# Patient Record
Sex: Female | Born: 1985 | Race: White | Hispanic: No | Marital: Married | Smoking: Never smoker
Health system: Southern US, Community
[De-identification: ages and names within clinical notes are randomized; demographics above are authoritative.]

## PROBLEM LIST (undated history)

## (undated) ENCOUNTER — Inpatient Hospital Stay (HOSPITAL_COMMUNITY): Payer: Self-pay

## (undated) DIAGNOSIS — T8859XA Other complications of anesthesia, initial encounter: Secondary | ICD-10-CM

## (undated) DIAGNOSIS — O24419 Gestational diabetes mellitus in pregnancy, unspecified control: Secondary | ICD-10-CM

## (undated) DIAGNOSIS — R519 Headache, unspecified: Secondary | ICD-10-CM

## (undated) DIAGNOSIS — E282 Polycystic ovarian syndrome: Secondary | ICD-10-CM

## (undated) DIAGNOSIS — T4145XA Adverse effect of unspecified anesthetic, initial encounter: Secondary | ICD-10-CM

## (undated) DIAGNOSIS — B977 Papillomavirus as the cause of diseases classified elsewhere: Secondary | ICD-10-CM

## (undated) DIAGNOSIS — M5020 Other cervical disc displacement, unspecified cervical region: Secondary | ICD-10-CM

## (undated) DIAGNOSIS — O009 Unspecified ectopic pregnancy without intrauterine pregnancy: Secondary | ICD-10-CM

## (undated) DIAGNOSIS — E119 Type 2 diabetes mellitus without complications: Secondary | ICD-10-CM

## (undated) DIAGNOSIS — E079 Disorder of thyroid, unspecified: Secondary | ICD-10-CM

## (undated) DIAGNOSIS — Z8619 Personal history of other infectious and parasitic diseases: Secondary | ICD-10-CM

## (undated) DIAGNOSIS — Z8639 Personal history of other endocrine, nutritional and metabolic disease: Secondary | ICD-10-CM

## (undated) DIAGNOSIS — N946 Dysmenorrhea, unspecified: Secondary | ICD-10-CM

## (undated) DIAGNOSIS — K219 Gastro-esophageal reflux disease without esophagitis: Secondary | ICD-10-CM

## (undated) HISTORY — DX: Disorder of thyroid, unspecified: E07.9

## (undated) HISTORY — DX: Papillomavirus as the cause of diseases classified elsewhere: B97.7

## (undated) HISTORY — DX: Dysmenorrhea, unspecified: N94.6

## (undated) HISTORY — PX: TONSILLECTOMY: SUR1361

## (undated) HISTORY — DX: Other cervical disc displacement, unspecified cervical region: M50.20

## (undated) HISTORY — PX: CRANIOTOMY: SHX93

## (undated) HISTORY — DX: Personal history of other endocrine, nutritional and metabolic disease: Z86.39

## (undated) HISTORY — DX: Polycystic ovarian syndrome: E28.2

## (undated) HISTORY — DX: Personal history of other infectious and parasitic diseases: Z86.19

## (undated) HISTORY — PX: SHOULDER SURGERY: SHX246

## (undated) HISTORY — DX: Unspecified ectopic pregnancy without intrauterine pregnancy: O00.90

## (undated) HISTORY — PX: OTHER SURGICAL HISTORY: SHX169

---
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
## 2006-10-06 ENCOUNTER — Ambulatory Visit: Payer: Self-pay | Admitting: Family Medicine

## 2006-10-06 LAB — CONVERTED CEMR LAB: Hgb A1c MFr Bld: 8 %

## 2006-10-07 ENCOUNTER — Encounter (INDEPENDENT_AMBULATORY_CARE_PROVIDER_SITE_OTHER): Payer: Self-pay | Admitting: Family Medicine

## 2006-10-07 LAB — CONVERTED CEMR LAB
ALT: 56 units/L — ABNORMAL HIGH (ref 0–35)
AST: 42 units/L — ABNORMAL HIGH (ref 0–37)
Albumin: 4.6 g/dL (ref 3.5–5.2)
Alkaline Phosphatase: 51 units/L (ref 39–117)
BUN: 15 mg/dL (ref 6–23)
Basophils Absolute: 0.1 10*3/uL (ref 0.0–0.1)
Basophils Relative: 1 % (ref 0–1)
C-Peptide: 1.83 ng/mL (ref 0.80–3.90)
CO2: 17 meq/L — ABNORMAL LOW (ref 19–32)
Calcium: 9.3 mg/dL (ref 8.4–10.5)
Chloride: 102 meq/L (ref 96–112)
Cholesterol: 212 mg/dL — ABNORMAL HIGH (ref 0–200)
Creatinine, Ser: 0.62 mg/dL (ref 0.40–1.20)
Eosinophils Relative: 2 % (ref 0–5)
Glucose, Bld: 163 mg/dL — ABNORMAL HIGH (ref 70–99)
HCT: 45.4 % (ref 36.0–46.0)
HDL: 57 mg/dL (ref >39)
Hemoglobin: 15.1 g/dL — ABNORMAL HIGH (ref 12.0–15.0)
LDL Cholesterol: 94 mg/dL (ref 0–99)
Lymphocytes Relative: 37 % (ref 12–46)
Lymphs Abs: 2.5 10*3/uL (ref 0.7–3.3)
MCHC: 33.3 g/dL (ref 30.0–36.0)
MCV: 85.8 fL (ref 78.0–100.0)
Monocytes Absolute: 0.4 10*3/uL (ref 0.2–0.7)
Monocytes Relative: 6 % (ref 3–11)
Neutro Abs: 3.6 10*3/uL (ref 1.7–7.7)
Neutrophils Relative %: 54 % (ref 43–77)
Platelets: 275 10*3/uL (ref 150–400)
Potassium: 4.2 meq/L (ref 3.5–5.3)
RBC count: 5.29 10*6/uL
RBC: 5.29 M/uL — ABNORMAL HIGH (ref 3.87–5.11)
RDW: 12.4 % (ref 11.5–14.0)
Sodium: 138 meq/L (ref 135–145)
TSH: 1.977 microintl units/mL (ref 0.350–5.50)
Total Bilirubin: 0.7 mg/dL (ref 0.3–1.2)
Total CHOL/HDL Ratio: 3.7
Total Protein: 7.9 g/dL (ref 6.0–8.3)
Triglycerides: 306 mg/dL — ABNORMAL HIGH (ref <150)
VLDL: 61 mg/dL — ABNORMAL HIGH (ref 0–40)
WBC, blood: 6.7 10*3/uL
WBC: 6.7 10*3/uL (ref 4.0–10.5)

## 2006-10-18 ENCOUNTER — Encounter: Payer: Self-pay | Admitting: Family Medicine

## 2006-10-18 DIAGNOSIS — G43909 Migraine, unspecified, not intractable, without status migrainosus: Secondary | ICD-10-CM | POA: Insufficient documentation

## 2006-10-18 DIAGNOSIS — K589 Irritable bowel syndrome without diarrhea: Secondary | ICD-10-CM | POA: Insufficient documentation

## 2006-10-18 DIAGNOSIS — F411 Generalized anxiety disorder: Secondary | ICD-10-CM | POA: Insufficient documentation

## 2006-10-18 DIAGNOSIS — E119 Type 2 diabetes mellitus without complications: Secondary | ICD-10-CM | POA: Insufficient documentation

## 2006-10-18 DIAGNOSIS — K219 Gastro-esophageal reflux disease without esophagitis: Secondary | ICD-10-CM | POA: Insufficient documentation

## 2006-10-18 DIAGNOSIS — E039 Hypothyroidism, unspecified: Secondary | ICD-10-CM | POA: Insufficient documentation

## 2006-10-24 ENCOUNTER — Ambulatory Visit: Payer: Self-pay | Admitting: Family Medicine

## 2006-10-25 ENCOUNTER — Encounter (INDEPENDENT_AMBULATORY_CARE_PROVIDER_SITE_OTHER): Payer: Self-pay | Admitting: Family Medicine

## 2006-10-25 LAB — CONVERTED CEMR LAB
HCV Ab: NEGATIVE
Hepatitis B Surface Ag: NEGATIVE
Hepatitis B-Post: 27 milliintl units/mL

## 2006-12-08 ENCOUNTER — Telehealth (INDEPENDENT_AMBULATORY_CARE_PROVIDER_SITE_OTHER): Payer: Self-pay | Admitting: Family Medicine

## 2006-12-09 ENCOUNTER — Ambulatory Visit: Payer: Self-pay | Admitting: Family Medicine

## 2006-12-09 DIAGNOSIS — E1165 Type 2 diabetes mellitus with hyperglycemia: Secondary | ICD-10-CM

## 2006-12-09 DIAGNOSIS — IMO0001 Reserved for inherently not codable concepts without codable children: Secondary | ICD-10-CM | POA: Insufficient documentation

## 2006-12-09 DIAGNOSIS — B9789 Other viral agents as the cause of diseases classified elsewhere: Secondary | ICD-10-CM | POA: Insufficient documentation

## 2006-12-09 LAB — CONVERTED CEMR LAB
Glucose, Bld: 173 mg/dL
Inflenza A Ag: NEGATIVE
Rapid Strep: NEGATIVE

## 2006-12-19 ENCOUNTER — Telehealth (INDEPENDENT_AMBULATORY_CARE_PROVIDER_SITE_OTHER): Payer: Self-pay | Admitting: Family Medicine

## 2007-04-05 ENCOUNTER — Telehealth (INDEPENDENT_AMBULATORY_CARE_PROVIDER_SITE_OTHER): Payer: Self-pay | Admitting: Family Medicine

## 2007-04-11 ENCOUNTER — Encounter (INDEPENDENT_AMBULATORY_CARE_PROVIDER_SITE_OTHER): Payer: Self-pay | Admitting: Family Medicine

## 2008-02-24 ENCOUNTER — Encounter: Payer: Self-pay | Admitting: Orthopedic Surgery

## 2008-02-27 ENCOUNTER — Encounter: Payer: Self-pay | Admitting: Orthopedic Surgery

## 2008-03-13 ENCOUNTER — Encounter: Payer: Self-pay | Admitting: Orthopedic Surgery

## 2008-03-18 ENCOUNTER — Ambulatory Visit: Payer: Self-pay | Admitting: Orthopedic Surgery

## 2008-03-18 DIAGNOSIS — M25519 Pain in unspecified shoulder: Secondary | ICD-10-CM | POA: Insufficient documentation

## 2008-03-21 ENCOUNTER — Encounter: Payer: Self-pay | Admitting: Orthopedic Surgery

## 2008-03-26 ENCOUNTER — Telehealth: Payer: Self-pay | Admitting: Orthopedic Surgery

## 2008-04-09 ENCOUNTER — Ambulatory Visit: Payer: Self-pay | Admitting: Orthopedic Surgery

## 2008-04-09 DIAGNOSIS — M7580 Other shoulder lesions, unspecified shoulder: Secondary | ICD-10-CM

## 2008-04-09 DIAGNOSIS — M25819 Other specified joint disorders, unspecified shoulder: Secondary | ICD-10-CM | POA: Insufficient documentation

## 2008-04-09 DIAGNOSIS — S4350XA Sprain of unspecified acromioclavicular joint, initial encounter: Secondary | ICD-10-CM | POA: Insufficient documentation

## 2008-04-10 ENCOUNTER — Encounter: Payer: Self-pay | Admitting: Orthopedic Surgery

## 2008-04-17 ENCOUNTER — Ambulatory Visit: Payer: Self-pay | Admitting: Orthopedic Surgery

## 2008-04-19 ENCOUNTER — Telehealth: Payer: Self-pay | Admitting: Orthopedic Surgery

## 2008-04-25 ENCOUNTER — Ambulatory Visit (HOSPITAL_COMMUNITY): Admission: RE | Admit: 2008-04-25 | Discharge: 2008-04-25 | Payer: Self-pay | Admitting: Orthopedic Surgery

## 2008-04-25 ENCOUNTER — Encounter: Payer: Self-pay | Admitting: Orthopedic Surgery

## 2008-05-16 ENCOUNTER — Ambulatory Visit: Payer: Self-pay | Admitting: Orthopedic Surgery

## 2008-06-19 ENCOUNTER — Ambulatory Visit: Payer: Self-pay | Admitting: Orthopedic Surgery

## 2008-06-20 ENCOUNTER — Encounter: Payer: Self-pay | Admitting: Orthopedic Surgery

## 2008-06-20 ENCOUNTER — Telehealth: Payer: Self-pay | Admitting: Orthopedic Surgery

## 2009-10-29 ENCOUNTER — Ambulatory Visit (HOSPITAL_COMMUNITY): Admission: RE | Admit: 2009-10-29 | Discharge: 2009-10-29 | Payer: Self-pay | Admitting: Preventative Medicine

## 2010-01-13 ENCOUNTER — Inpatient Hospital Stay (HOSPITAL_COMMUNITY): Admission: AD | Admit: 2010-01-13 | Discharge: 2010-01-16 | Payer: Self-pay | Admitting: Family Medicine

## 2010-01-13 ENCOUNTER — Ambulatory Visit: Payer: Self-pay | Admitting: Orthopedic Surgery

## 2010-01-16 ENCOUNTER — Encounter: Payer: Self-pay | Admitting: Orthopedic Surgery

## 2010-01-26 ENCOUNTER — Telehealth: Payer: Self-pay | Admitting: Orthopedic Surgery

## 2010-10-25 ENCOUNTER — Encounter: Payer: Self-pay | Admitting: Family Medicine

## 2010-11-03 NOTE — Letter (Signed)
Summary: Out of Work  [facility]  [address]  [place], [place] [address]   Phone: [phone]  Fax: [phone]    March 18, 2008   Employee:  Kaitlyn Costa    To Whom It May Concern:   For Medical reasons, please excuse the above named employee from work for the following dates:  Start:   03/18/08  End:   04/09/08  If you need additional information, please feel free to contact our office.         Sincerely,    Terrance Mass, MD

## 2010-11-03 NOTE — Assessment & Plan Note (Signed)
Summary: WORK IN PER DR   Vital Signs:  Patient Profile:   25 Years Old Female Height:     66 inches (167.64 cm) Weight:      224 pounds BMI:     36.29 O2 Sat:      8 % Temp:     97.6 degrees F Pulse rate:   98 / minute Pulse (ortho):   97 / minute Resp:     18 per minute BP sitting:   154 / 114               Visit Type:  Acute Visit PCP:  Franchot Heidelberg. MD  Chief Complaint:  sore throat, fever, cough, congestion, body aches, and URI symptoms.  History of Present Illness:  URI Symptoms      This is a 25 year old woman who presents with URI symptoms.  Pt has been sick for three days.  Notes everyone at work sick. Hard to swallow. Had flu-shot. She is also borderline Diabteci and states sugar was 380 at [place] where she works. She has not tried anyhting for this. States she has had shots forthis before - Dr. Phillips Odor and Dr. Sherril Croon.  The patient reports nasal congestion, clear nasal discharge, sore throat, dry cough, and sick contacts, but denies earache.  Associated symptoms include low-grade fever (<100.5 degrees).  The patient also reports itchy watery eyes, sneezing, seasonal symptoms, muscle aches, and severe fatigue.  The patient denies headache.    Diabetes Management History:      The patient is a 25 years old female who comes in for evaluation of DM Type 2.  She is (or has been) enrolled in the "Diabetic Education Program".  She states understanding of dietary principles but she is not following the appropriate diet.  No sensory loss is reported.  Self foot exams are being performed.  She is checking home blood sugars.  She says that she is not exercising regularly.        Hypoglycemic symptoms are not occurring.  No hyperglycemic symptoms are reported.  Other comments include: BS in the 120 when she was taking Metformin - just does not remember to take it.        There are no symptoms to suggest diabetic complications.  Since her last visit, no infections have occurred.  The  following treatment plan problems are reported: Remembering pills.  No changes have been made to her treatment plan since last visit.     Prior Medications: Current Allergies: ! 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 Pt tearful. Notes she is not sleeping Head:     Normocephalic and atraumatic without obvious abnormalities. No apparent alopecia or balding. Eyes:     Mild injection  Ears:     External ear exam shows no significant lesions or deformities.  Otoscopic examination reveals clear canals, tympanic membranes are intact bilaterally without bulging, retraction, inflammation or discharge. Hearing is grossly normal bilaterally. Nose:     Mod Turbinate Injection with copious clear liquid both nares Mouth:     Oral mucosa and oropharynx without lesions or exudates.  Teeth in good repair. Lungs:     Normal respiratory effort, chest expands symmetrically. Lungs are clear to auscultation, no crackles or wheezes. Heart:     Normal rate and regular rhythm. S1 and S2 normal without gallop, murmur, click, rub or other extra sounds. Abdomen:     Bowel sounds positive,abdomen soft and non-tender without masses, organomegaly or hernias noted. Extremities:     No clubbing, cyanosis, edema, or deformity noted with normal full range of motion of all joints.   Cervical Nodes:     Bilateral cervical  adenopathy - < 1 cm    Impression & Recommendations:  Problem # 1:  INFECTION, VIRAL NOS (ICD-079.99) Reassured swabs negative. Advised fluid push, rest and MVI. Off work for 72 hours. Treat sx using Chloraseptic, salt water gargles for sore throat and will add decongestantin form of Polytan DM two teaspoons two times a day for 5 to 7 days. F/up if worse or not better in 7 days. Single injection of Vistaril to help decongest but also to help pt get somerest as she is tearful from not feeling well and not having slept in 2 nights. Orders: Flu A+B (13244) Rapid Strep (01027) Vistaril 25mg  (J3410) Admin of Therapeutic Inj  intramuscular or subcutaneous (25366)   Problem # 2:  DIABETES MELLITUS, TYPE II (ICD-250.00) Pt not compliant with meds. Glucose elevated. Councelled on need to take meds daily. Will recheck in 2 weeks to optomize. Her updated medication list for this problem includes:    Glucophage 500 Mg Tabs (Metformin hcl) ..... One by mouth twice daily  Orders: Capillary Blood Glucose (44034)   Medications Added to Medication List This Visit: 1)  Glucophage 500 Mg Tabs (Metformin hcl) .... One by mouth twice daily  Diabetes Management Assessment/Plan:      The following lipid goals have been established for the patient: Total cholesterol goal of 200; LDL cholesterol goal of 100; HDL cholesterol goal of 40; Triglyceride goal of 200.     Patient Instructions: 1)  Please schedule a follow-up appointment in 2 weeks - sooner if needed.    Laboratory Results   Blood Tests     Glucose (random): 173 mg/dL   (Normal Range: 74-259)   Other Tests    [place]et [place]/KOH Rapid Strep: negative Influenza: negative    Laboratory Results   Blood Tests     Glucose (random): 173 mg/dL   (Normal Range: 56-387)  CBC   [place]et [place]/KOH  Other Tests  Rapid Strep: negative Influenza: negative    Medication Administration  Injection # 1:    Medication: Vistaril 25mg      Diagnosis: INFECTION, VIRAL NOS (ICD-079.99)    Route: IM    Site: L thigh    Exp Date: 01/03/2008    Lot #: [id number]    Mfr: american [email or web address]    Patient tolerated injection without complications    Given by: Sherilyn Banker (December 09, 2006 10:18 AM)  Orders Added: 1)  Capillary Blood Glucose [82948] 2)  Flu A+B [87400] 3)  Rapid Strep [56433] 4)  Est. Patient Level III [29518] 5)  Vistaril 25mg  [A4166]  6)  Admin of Therapeutic Inj  intramuscular or subcutaneous [90772]

## 2010-11-03 NOTE — Letter (Signed)
Summary: Hospital prog note  Hospital prog note   Imported By: Cammie Sickle 01/21/2010 18:00:04  _____________________________________________________________________  External Attachment:    Type:   Image     Comment:   External Document

## 2010-11-03 NOTE — Progress Notes (Signed)
Summary: call to patient  Phone Note Outgoing Call   Call placed to: Patient Summary of Call: I called patient; left a voice message re: fol/up appointment/hospital care.  Also need to verify [employer].  No return call as of 01/28/10.  Left a 2nd voice message Initial call taken by: Cammie Sickle,  January 26, 2010 5:36 PM  Follow-up for Phone Call        she will follow with pmd   cancel appt Follow-up by: Fuller Canada MD,  January 29, 2010 7:34 AM  Additional Follow-up for Phone Call Additional follow up Details #1::        patient's mother had left [place] a message, per previous message that she has been working and has been unable to return call. I called back and left updated  message, per above, per Dr Romeo Apple. Additional Follow-up by: Cammie Sickle,  January 30, 2010 10:46 AM

## 2010-11-03 NOTE — Miscellaneous (Signed)
Summary: Rehab Report  Rehab Report   Imported By: Jacklynn Ganong 04/10/2008 12:07:10  _____________________________________________________________________  External Attachment:    Type:   Image     Comment:   PT Progress note

## 2010-11-03 NOTE — Progress Notes (Signed)
Summary: Wants to see OBGYN  Phone Note Call from Patient   Reason for Call: Talk to Nurse Summary of Call: PATIENT WANTS A REFERRAL FOR OB/GYN TO DR Gwynne Edinger IN [place]...  PLEASE ADVISE- [phone] Initial call taken by: Donneta Romberg,  April 05, 2007 3:41 PM  Follow-up for Phone Call        OK to send, this is for her yearly exam Follow-up by: Sonny Dandy,  April 05, 2007 4:04 PM  Additional Follow-up for Phone Call Additional follow up Details #1::        Please  advise we need to see her for follow-up on DM etc. Has not had follow-up on this in several months. Advise we will make referal to GYN when we see her for DM follow-up. Schedule for next week. Additional Follow-up by: Franchot Heidelberg MD,  April 05, 2007 5:49 PM   Additional Follow-up for Phone Call Additional follow up Details #2::    message left for patient to return call  .................................................................Marland KitchenMarland KitchenBritt Bottom Long  April 06, 2007 9:01 AM  message left for patient to return call  .................................................................Marland KitchenMarland KitchenBritt Bottom Long  April 10, 2007 8:28 AM             Unable to contact, letter mailed .................................................................Marland KitchenMarland KitchenSonny Dandy  April 11, 2007 8:55 AM  Follow-up by: Sonny Dandy,  April 06, 2007 9:01 AM

## 2010-11-03 NOTE — Letter (Signed)
Summary: Out of Work  [facility]  [address]  [place], [place] [address]   Phone: [phone]  Fax: [phone]    April 25, 2008   Employee:  Octa Renella Cunas    To Whom It May Concern:   For Medical reasons, please excuse the above named employee from work for the following dates:  Start:   05/05/08  End:   05/16/08  Next scheduled appointment date:  05/16/08  If you need additional information, please feel free to contact our office.         Sincerely,    Terrance Mass, MD

## 2010-11-03 NOTE — Assessment & Plan Note (Signed)
Summary: 3 wk re-ck LT shoulder[place]CAF    PCP:  Franchot Heidelberg. MD   History of Present Illness: I saw Kaitlyn Costa in the office today for a followup visit.  She is a 25 years old woman with the complaint of:  left shoulder pain   fall 02-24-08 from a horse.  this is a 25 year old female status post right shoulder reconstruction several years ago. She fell off a horse on the 23rd and saw Dr. Edger House for evaluation and treatment. His evaluation revealed that she had an a.c. separation. There was some discussion of rotator cuff tear based on proximal shoulder and humeral films taken in the emergency room at [facility] on May 23.  The patient's been in a sling but has not had any therapy at this time. She does take the sling off when she is at home but wears it when she is out in the community.  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  Dr. Edger House gave her a work note from June ninth through 15th.  She complains of pain in the proximal shoulder, with difficulty lifting her arm away from her body.  She is still having pain and stiffness after 3 weeks of rest, PT and sling wear.         Updated Prior Medication List: GLUCOPHAGE 500 MG TABS (METFORMIN HCL) One by mouth twice daily  Current Allergies: ! 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 severe pain over Left AC joint     Impression & Recommendations:  Problem # 1:  SHOULDER PAIN (ICD-719.41) Assessment: Unchanged  Orders: Est. Patient Level III ([address]) Depo- Medrol 40mg  (J1030) Joint Aspirate / Injection, Large (20610)   Problem # 2:  ACROMIOCLAVICULAR SPRAIN AND STRAIN (ICD-840.0) Assessment: Unchanged Based on todays exam I really think she has an AC sprain and some post traumatic impingement.   Orders: Est. Patient Level III (60454)  Problem # 3:  IMPINGEMENT SYNDROME (ICD-726.2) Assessment: Unchanged   Patient Instructions: 1)  You have received an injection of cortisone today. You may experience increased pain at the injection site. Apply ice pack to the area for 20 minutes every 2 hours and take 2 xtra strength tylenol every 8 hours. This increased pain will usually resolve in 24 hours. The injection will take effect in 3-10 days.  2)  No PT  3)  return  in 1 week  4)  OOW extend 1 week   ]

## 2010-11-03 NOTE — Miscellaneous (Signed)
Summary: Rehab Report  Rehab Report   Imported By: Jacklynn Ganong 04/02/2008 16:26:27  _____________________________________________________________________  External Attachment:    Type:   Image     Comment:   PT evaluation

## 2010-11-03 NOTE — Letter (Signed)
Summary: Out of Work  [facility]  [address]   [place], [place] [address]   Phone: [phone]  Fax: [phone]    December 09, 2006   Employee:  Kaitlyn Costa    To Whom It May Concern:   For Medical reasons, please excuse the above named employee from work for the following dates:  Start:   12/08/06  End:   12/11/06  If you need additional information, please feel free to contact our office.         Sincerely,    Franchot Heidelberg MD

## 2010-11-03 NOTE — Progress Notes (Signed)
Summary: As pt requests,office notes faxed  Phone Note Call from Patient   Caller: Patient Summary of Call: Pt requests copy of office notes of 03/18/08 to be faxed to home fax [phone]) --  CALLED PT BACK, AS THIS FAX # NOT WORKING.   Pt GAVE ANOTHER HOME FAX # [phone] .  SENT. Initial call taken by: Cammie Sickle,  March 26, 2008 9:59 AM

## 2010-11-03 NOTE — Letter (Signed)
Summary: Historic correspondence  Historic correspondence   Imported By: Curtis Sites 07/28/2009 14:21:11  _____________________________________________________________________  External Attachment:    Type:   Image     Comment:   External Document

## 2010-11-03 NOTE — Letter (Signed)
Summary: Out of Work  [facility]  [address]  [place], [place] [address]   Phone: [phone]  Fax: [phone]    April 17, 2008   Employee:  Kaitlyn Costa    To Whom It May Concern:   For Medical reasons, please excuse the above named employee from work for the following dates:  Start:   04/16/08  End:   05/05/08  If you need additional information, please feel free to contact our office.         Sincerely,    Terrance Mass, MD

## 2010-11-03 NOTE — Letter (Signed)
Summary: Dischg summary  Dischg summary   Imported By: Cammie Sickle 01/21/2010 17:59:16  _____________________________________________________________________  External Attachment:    Type:   Image     Comment:   External Document

## 2010-11-03 NOTE — Letter (Signed)
Summary: Referral to OB/GYN  Referral to OB/GYN   Imported By: Pablo Lawrence 04/11/2007 11:12:03  _____________________________________________________________________  External Attachment:    Type:   Image     Comment:   External Document

## 2010-11-03 NOTE — Assessment & Plan Note (Signed)
Summary: RE-CK LT SHOULDER FOL'G INJEC/BCBS/CAF    PCP:  Franchot Heidelberg. MD   History of Present Illness: I saw Kaitlyn Costa in the office today for a followup visit.  She is a 25 years old woman with the complaint of:  recheck left shoulder after injection.  02-24-08 fell from horse.  She has had physical therapy for 3 weeks and an injection but has not seen much improvement.   Medication: none (pill phobia)     Current Allergies: ! AUGMENTIN        Impression & Recommendations:  Problem # 1:  ACROMIOCLAVICULAR SPRAIN AND STRAIN (ICD-840.0) Assessment: Unchanged  Orders: Est. Patient Level II ([address])   Problem # 2:  IMPINGEMENT SYNDROME (ICD-726.2) Assessment: Unchanged Kaitlyn Costa has made no improvement to speak of. She cannot take anti-inflammatories. She does not like to take pills. She has failed physical therapy and injection treatment. Recommend MRI to rule out rotator cuff tear. Rule out shoulder instability from labral tear.   Orders: Est. Patient Level II (52841)   Medications Added to Medication List This Visit: 1)  Childrens Motrin 100 Mg/32ml Susp (Ibuprofen) .... 20 ml q 8 for pain in the left shoulder   Patient Instructions: 1)  MRI stay out of work    Prescriptions: CHILDRENS MOTRIN 100 MG/5ML  SUSP (IBUPROFEN) 20 ml q 8 for pain in the left shoulder  #1 liter x 2   Entered and Authorized by:   Fuller Canada MD   Signed by:   Fuller Canada MD on 04/17/2008   Method used:   Print then Give to Patient   RxID:   [id number]  ]

## 2010-11-03 NOTE — Assessment & Plan Note (Signed)
Summary: mri results from [facility]/fr[facility]    PCP:  Franchot Heidelberg. MD   History of Present Illness: I saw Kaitlyn Costa in the office today for a followup visit.  She is a 25 years old woman with the complaint of:  MRI results from [facility] of the left shouder taken 04-25-08.   Advance for age appearing acromioclavicular degenerative disease with marked bony edema about the joint. 2.  Negative for rotator cuff tear.  Still having pain with shoulder. injured 02/24/08  NEUROLOGY:  02-24-08 fell from horse.  She had physical therapy for 3 weeks and an injection but has not seen much improvement.   Medication: none (pill phobia)     Current Allergies: ! AUGMENTIN      Physical Exam  GENERAL: Normal appearance CDV: normal temp, color and pulses PSYCHE: Awake and alert NEURO: normal sensation  MSK:  left shoulder: PROM:  flexion-150, pain 100-150/ Ext Rot-35   cross chest test strongly positive. severe pain over Left AC joint     Impression & Recommendations:  Problem # 1:  ACROMIOCLAVICULAR SPRAIN AND STRAIN (ICD-840.0) Assessment: Unchanged MRI see hpi  Orders: Est. Patient Level III (91478)    Patient Instructions: 1)  walking for exercise  2)  CODMAN EXERCISES ON THE SHEET  3)  Please schedule a follow-up appointment in 1 month. 4)  Recommended remaining out of work; out of work note provided.   ]

## 2010-11-03 NOTE — Letter (Signed)
Summary: Out of Work  [facility]  [address]  [place], [place] [address]   Phone: [phone]  Fax: [phone]    June 20, 2008   Employee:  Eldoris Renella Cunas    To Whom It May Concern:   For Medical reasons, please excuse the above named employee from work for the following dates:  Start:   June 19, 2008  End/Return to Work Full Duty / No restrictions:   June 20, 2008  If you need additional information, please feel free to contact our office.         Sincerely,    Terrance Mass, MD

## 2010-11-03 NOTE — Progress Notes (Signed)
Summary: Office Visit  Office Visit   Imported By: Jacklynn Ganong 04/16/2008 10:05:07  _____________________________________________________________________  External Attachment:    Type:   Image     Comment:   initial evaluation

## 2010-11-03 NOTE — Assessment & Plan Note (Signed)
Summary: EVAL SHOULDER/XRAY @[facility] -CD+REP REC'D,REV'D BY DR H/BCBS/CAF   Vital Signs:  Patient Profile:   25 Years Old Female Height:     66 inches (167.64 cm) Weight:      208 pounds Pulse rate:   78 / minute Resp:     16 per minute  Vitals Entered By: Fuller Canada MD (March 18, 2008 2:42 PM)                 PCP:  Franchot Heidelberg. MD  Chief Complaint:  left shoulder pain.  History of Present Illness: I saw Kaitlyn Costa in the office today for an initial visit.  She is a 25 years old woman with the complaint of:  left shoulder pain after fall 02-24-08 from a horse.  this is a 25 year old female status post right shoulder reconstruction several years ago. She fell off a horse on the 23rd and saw Dr. Althea Charon for evaluation and treatment. His evaluation revealed that she had an a.c. separation. There was some discussion of rotator cuff tear based on proximal shoulder and humeral films taken in the emergency room at [facility] on May 23.  The patient's been in a sling but has not had any therapy at this time. She does take the sling off when she is at home but wears it when she is out in the community.  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  Dr. Edger House gave her a work note from June ninth through 15th.   She complains of pain in the proximal shoulder, with difficulty lifting her arm away from her body.         Updated Prior Medication List: GLUCOPHAGE 500 MG TABS (METFORMIN HCL) One by mouth twice daily  Current Allergies: ! AUGMENTIN  Past Surgical History:    SHOULDER SURGERY 2001 rt    CRAINIOTOMY 1992    Risk Factors:  Caffeine use:  1 drinks per day   Review of Systems  General      Complains of weight loss.  GI      Complains of nausea, vomiting, diarrhea, and difficulty swallowing.  MS      Complains of joint pain and joint swelling.  Endo      Complains of thyroid disease.  Psych      Complains of depression, mood  swings, anxiety, and panic attack.  Immunology      Complains of seasonal allergies and sinus problems.   Physical Exam  Constitutional: vital signs see recorded values. General: normal development, nutrition, and grooming. No deformity. Body Habitus large CDV: Observation and palpation was normal  Lymph: palpation of the lymph nodes were normal Skin: inspection and palpation of the skin revealed no abnormalities  Neuro: coordination: normal              DTR's normal              Sensation was normal  Psyche: Mood was normal.  Affect: normal  MSK: Gait: normal      Shoulder/Elbow Exam  Shoulder Exam:    Right:    Inspection:  Normal    Palpation:  Normal    Stability:  stable    Tenderness:  no    Swelling:  no    Erythema:  no    Range of Motion:       Flexion-Active: 180       Extension-Active: 45       Flexion-Passive: 180  Extension-Passive: 45       External Rotation : 45       Interior Rotation : T7    Left:    Inspection:  Abnormal    Palpation:  Abnormal    Stability:  stable    Tenderness:  left a.c. joint    Swelling:  no    Erythema:  no    Range of Motion:       Flexion-Active: 110       Extension-Active: 45       Flexion-Passive: 180       Extension-Passive: 45       External Rotation : 60       Interior Rotation : T12  Impingement Sign NEER:    Left positive Impingement Sign HAWKINS:    Left positive Apprehension Sign:    Left negative Relocation Test:    Left negative Sulcus Sign:    Left negative AC joint Adduction Test:    Left negative    Impression & Recommendations:  Problem # 1:  SHOULDER PAIN (ICD-719.41) Assessment: New At this point it is difficult to tell whether she had an a.c. separation or strain to the rotator cuff. I think a period of rest and physical therapy should do her well.  Redressed revealed from [facility] show no acute fracture dislocation or separation of the a.c. joint.   Orders: Physical  Therapy Referral (PT) New Patient Level III ([address])    Patient Instructions: 1)  Therapy on Left shoulder  2)  return in 3 weeks  3)  OOW 3 weeks    ]

## 2010-11-03 NOTE — Letter (Signed)
Summary: Out of Work  [facility]  [address]  [place], [place] [address]   Phone: [phone]  Fax: [phone]    April 09, 2008   Employee:  Kaitlyn Costa    To Whom It May Concern:   For Medical reasons, please excuse the above named employee from work for the following dates:  Start:   04/09/08  End:   04/17/08  (Next scheduled appointment:  04/17/08  If you need additional information, please feel free to contact our office.         Sincerely,   Terrance Mass, MD

## 2010-11-03 NOTE — Letter (Signed)
Summary: Historic rma chart  Historic rma chart   Imported By: Curtis Sites 07/24/2009 10:25:17  _____________________________________________________________________  External Attachment:    Type:   Image     Comment:   External Document

## 2010-11-03 NOTE — Progress Notes (Signed)
Summary: Pt called w/decision re: surg discussed  Phone Note Call from Patient   Caller: Patient Summary of Call: Pt called re: surgery procedure discussed at yesterday's visit.  States has decided to hold on the surgery.  Said discussed return to work full duty/no restrictions with Dr Romeo Apple.  Requests note to return on Tues, 06/25/08.  (Note written for approval/sign off by Dr.) Initial call taken by: Cammie Sickle,  June 20, 2008 5:14 PM

## 2010-11-03 NOTE — Progress Notes (Signed)
Summary: req fax of off notes to home  Phone Note Call from Patient   Summary of Call: req off notes of 04/18/08 visit faxed to home [phone]. done. Initial call taken by: Cammie Sickle,  April 19, 2008 12:44 PM

## 2010-11-03 NOTE — Letter (Signed)
Summary: history form  history form   Imported By: Jacklynn Ganong 03/20/2008 10:17:57  _____________________________________________________________________  External Attachment:    Type:   Image     Comment:   External Document

## 2010-11-03 NOTE — Progress Notes (Signed)
Summary: questions about medication  Phone Note Call from Patient   Caller: Patient Call For: kim Reason for Call: Talk to Nurse Summary of Call: patient has been feeling sick,tired and thinks she might be pregent has question for dr. Claire Shown nurse  patient's phone number is [phone] Initial call taken by: Alden Server,  December 19, 2006 1:44 PM  Follow-up for Phone Call        pt has appt on  mar. 25 th. she has taken a home pregnancy test and it was negative. Follow-up by: Sherilyn Banker,  December 19, 2006 3:07 PM

## 2010-11-03 NOTE — Assessment & Plan Note (Signed)
Summary: RE-CK LT SHOULDER FOL'G EXERCISES,MED/BCBS OUT OF ST/CAF    PCP:  Franchot Heidelberg. MD   History of Present Illness: I saw Kaitlyn Costa in the office today for a followup visit.  She is a 25 years old woman with the complaint of:  recheck on left shoulder after exercises and childrens motrin.  Patient states not much better, still has a knot and swelling at night. she cannot do some of her exercises. She states that it may be 25% better. she feels pressure. The pain is  primarily over the [facility] joint.   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  Negative for rotator cuff tear.  Still having pain with shoulder. injured 02/24/08  02-24-08 fell from horse.  She had physical therapy for 3 weeks and an injection but has not seen much improvement.   Medication: none (pill phobia)     Updated Prior Medication List: CHILDRENS MOTRIN 100 MG/5ML  SUSP (IBUPROFEN) 20 ml q 8 for pain in the left shoulder  Current Allergies (reviewed today): ! AUGMENTIN  Past Medical History:    Reviewed history from 10/18/2006 and no changes required:       Anxiety       Diabetes mellitus, type II       GERD       Hypothyroidism  Past Surgical History:    Reviewed history from 03/18/2008 and no changes required:       SHOULDER SURGERY 2001 rt       CRAINIOTOMY 1992      Physical Exam  GENERAL: Normal appearance CDV: normal temp, color and pulses PSYCHE: Awake and alert NEURO: normal sensation  MSK:  left shoulder: PROM:  flexion-150, pain 100-150/ Ext Rot-35   cross chest test strongly positive. severe pain over Left AC joint     Impression & Recommendations:  Problem # 1:  ACROMIOCLAVICULAR SPRAIN AND STRAIN (ICD-840.0) Assessment: Unchanged I have advised her to go ahead with surgical treatment of this injury with distal clavicle excision.  She is weightbearing on this decision and will  decide over 24 hours. I think we can get rid of her pain she should eventually return to 90% of strength in the left arm if she does not have surgery and I recommended she return to work.   Orders: Est. Patient Level III (81829)    Patient Instructions: 1)  She will call us in 24 hrs to let us know if she will have surgery    ]

## 2010-11-03 NOTE — Letter (Signed)
Summary: External Other  External Other   Imported By: Jacklynn Ganong 04/02/2008 16:25:40  _____________________________________________________________________  External Attachment:    Type:   Image     Comment:   disability form

## 2010-11-03 NOTE — Letter (Signed)
Summary: Out of Work  [facility]  [address]  [place], [place] [address]   Phone: [phone]  Fax: [phone]    May 16, 2008   Employee:  Kaitlyn Costa    To Whom It May Concern:   For Medical reasons, please excuse the above named employee from work for the following dates:  Start:   05/16/08  End:   06/19/08  Next Scheduled appointment:   06/19/08  If you need additional information, please feel free to contact our office.         Sincerely,    Terrance Mass, MD

## 2010-11-03 NOTE — Progress Notes (Signed)
Summary: swollen/sore throat  Phone Note Call from Patient   Caller: Patient Call For: kim Summary of Call: Patient states that her throat is swollen and sore...states that you can actually see the bulge..has cough and drainage...also her blood sugar was 380 at 11:00. Patient phone number [phone] Initial call taken by: Magdalene River,  December 08, 2006 1:04 PM  Follow-up for Phone Call        pt coming in today Follow-up by: Sherilyn Banker,  December 09, 2006 8:19 AM

## 2010-12-22 LAB — GLUCOSE, CAPILLARY: Glucose-Capillary: 199 mg/dL — ABNORMAL HIGH (ref 70–99)

## 2010-12-23 LAB — CBC
HCT: 39.6 % (ref 36.0–46.0)
Hemoglobin: 14.1 g/dL (ref 12.0–15.0)
MCHC: 35.6 g/dL (ref 30.0–36.0)
MCV: 83.8 fL (ref 78.0–100.0)
Platelets: 208 10*3/uL (ref 150–400)
RBC: 4.73 MIL/uL (ref 3.87–5.11)
RDW: 12.2 % (ref 11.5–15.5)
WBC: 5.6 10*3/uL (ref 4.0–10.5)

## 2010-12-23 LAB — GLUCOSE, CAPILLARY
Glucose-Capillary: 151 mg/dL — ABNORMAL HIGH (ref 70–99)
Glucose-Capillary: 156 mg/dL — ABNORMAL HIGH (ref 70–99)
Glucose-Capillary: 189 mg/dL — ABNORMAL HIGH (ref 70–99)
Glucose-Capillary: 216 mg/dL — ABNORMAL HIGH (ref 70–99)
Glucose-Capillary: 225 mg/dL — ABNORMAL HIGH (ref 70–99)
Glucose-Capillary: 246 mg/dL — ABNORMAL HIGH (ref 70–99)
Glucose-Capillary: 266 mg/dL — ABNORMAL HIGH (ref 70–99)
Glucose-Capillary: 290 mg/dL — ABNORMAL HIGH (ref 70–99)
Glucose-Capillary: 342 mg/dL — ABNORMAL HIGH (ref 70–99)
Glucose-Capillary: 365 mg/dL — ABNORMAL HIGH (ref 70–99)

## 2010-12-23 LAB — URINALYSIS, ROUTINE W REFLEX MICROSCOPIC
Bilirubin Urine: NEGATIVE
Glucose, UA: >1000 mg/dL — AB
Hgb urine dipstick: NEGATIVE
Ketones, ur: NEGATIVE mg/dL
Leukocytes, UA: NEGATIVE
Nitrite: NEGATIVE
Protein, ur: NEGATIVE mg/dL
Specific Gravity, Urine: 1.025 (ref 1.005–1.030)
Urobilinogen, UA: 0.2 mg/dL (ref 0.0–1.0)
pH: 5.5 (ref 5.0–8.0)

## 2010-12-23 LAB — COMPREHENSIVE METABOLIC PANEL
ALT: 28 U/L (ref 0–35)
AST: 23 U/L (ref 0–37)
Albumin: 3.9 g/dL (ref 3.5–5.2)
Alkaline Phosphatase: 51 U/L (ref 39–117)
BUN: 13 mg/dL (ref 6–23)
CO2: 26 mEq/L (ref 19–32)
Calcium: 9 mg/dL (ref 8.4–10.5)
Chloride: 101 mEq/L (ref 96–112)
Creatinine, Ser: 0.55 mg/dL (ref 0.4–1.2)
GFR calc Af Amer: >60 mL/min (ref >60)
GFR calc non Af Amer: >60 mL/min (ref >60)
Glucose, Bld: 228 mg/dL — ABNORMAL HIGH (ref 70–99)
Potassium: 3.7 mEq/L (ref 3.5–5.1)
Sodium: 134 mEq/L — ABNORMAL LOW (ref 135–145)
Total Bilirubin: 0.6 mg/dL (ref 0.3–1.2)
Total Protein: 6.6 g/dL (ref 6.0–8.3)

## 2010-12-23 LAB — URINE MICROSCOPIC-ADD ON

## 2010-12-23 LAB — DIFFERENTIAL
Basophils Absolute: 0 10*3/uL (ref 0.0–0.1)
Basophils Relative: 0 % (ref 0–1)
Eosinophils Absolute: 0 10*3/uL (ref 0.0–0.7)
Eosinophils Relative: 0 % (ref 0–5)
Lymphocytes Relative: 27 % (ref 12–46)
Lymphs Abs: 1.5 10*3/uL (ref 0.7–4.0)
Monocytes Absolute: 0.8 10*3/uL (ref 0.1–1.0)
Monocytes Relative: 14 % — ABNORMAL HIGH (ref 3–12)
Neutro Abs: 3.3 10*3/uL (ref 1.7–7.7)
Neutrophils Relative %: 59 % (ref 43–77)

## 2010-12-23 LAB — C-REACTIVE PROTEIN: CRP: 2.5 mg/dL — ABNORMAL HIGH (ref <0.6)

## 2010-12-23 LAB — WOUND CULTURE: Gram Stain: NONE SEEN

## 2010-12-23 LAB — C-PEPTIDE: C-Peptide: 3.39 ng/mL (ref 0.80–3.90)

## 2010-12-23 LAB — SEDIMENTATION RATE: Sed Rate: 7 mm/hr (ref 0–22)

## 2010-12-23 LAB — HEMOGLOBIN A1C
Hgb A1c MFr Bld: 10.6 % — ABNORMAL HIGH (ref <5.7)
Mean Plasma Glucose: 258 mg/dL — ABNORMAL HIGH (ref <117)

## 2014-07-02 ENCOUNTER — Other Ambulatory Visit: Payer: Self-pay | Admitting: General Practice

## 2014-07-02 DIAGNOSIS — R1013 Epigastric pain: Secondary | ICD-10-CM

## 2015-08-05 ENCOUNTER — Encounter (HOSPITAL_BASED_OUTPATIENT_CLINIC_OR_DEPARTMENT_OTHER): Payer: Self-pay | Admitting: Emergency Medicine

## 2015-08-05 ENCOUNTER — Emergency Department (HOSPITAL_BASED_OUTPATIENT_CLINIC_OR_DEPARTMENT_OTHER)
Admission: EM | Admit: 2015-08-05 | Discharge: 2015-08-05 | Disposition: A | Discharge status: Home / Self Care | Payer: Worker's Compensation | Attending: Emergency Medicine | Admitting: Emergency Medicine

## 2015-08-05 DIAGNOSIS — Y9389 Activity, other specified: Secondary | ICD-10-CM | POA: Insufficient documentation

## 2015-08-05 DIAGNOSIS — S61200A Unspecified open wound of right index finger without damage to nail, initial encounter: Secondary | ICD-10-CM | POA: Diagnosis not present

## 2015-08-05 DIAGNOSIS — Z880 Allergy status to penicillin: Secondary | ICD-10-CM | POA: Insufficient documentation

## 2015-08-05 DIAGNOSIS — S61250A Open bite of right index finger without damage to nail, initial encounter: Secondary | ICD-10-CM | POA: Diagnosis present

## 2015-08-05 DIAGNOSIS — W540XXA Bitten by dog, initial encounter: Secondary | ICD-10-CM | POA: Diagnosis not present

## 2015-08-05 DIAGNOSIS — Z23 Encounter for immunization: Secondary | ICD-10-CM | POA: Insufficient documentation

## 2015-08-05 DIAGNOSIS — Y998 Other external cause status: Secondary | ICD-10-CM | POA: Diagnosis not present

## 2015-08-05 DIAGNOSIS — S61210A Laceration without foreign body of right index finger without damage to nail, initial encounter: Secondary | ICD-10-CM | POA: Insufficient documentation

## 2015-08-05 DIAGNOSIS — S61209A Unspecified open wound of unspecified finger without damage to nail, initial encounter: Secondary | ICD-10-CM

## 2015-08-05 DIAGNOSIS — Y9289 Other specified places as the place of occurrence of the external cause: Secondary | ICD-10-CM | POA: Insufficient documentation

## 2015-08-05 MED ORDER — DOXYCYCLINE HYCLATE 100 MG PO CAPS: 100.0000 mg | ORAL_CAPSULE | Freq: Two times a day (BID) | ORAL | Status: DC

## 2015-08-05 MED ORDER — BACITRACIN ZINC 500 UNIT/GM EX OINT
TOPICAL_OINTMENT | Freq: Once | CUTANEOUS | Status: AC
  Administered 2015-08-05: 15.5556 via TOPICAL
  Filled 2015-08-05: qty 28.35

## 2015-08-05 MED ORDER — TETANUS-DIPHTH-ACELL PERTUSSIS 5-2.5-18.5 LF-MCG/0.5 IM SUSP
0.5000 mL | Freq: Once | INTRAMUSCULAR | Status: AC
  Administered 2015-08-05: 0.5 mL via INTRAMUSCULAR
  Filled 2015-08-05: qty 0.5

## 2015-08-05 NOTE — ED Provider Notes (Signed)
CSN: [id number]     Arrival date & time 08/05/15  2101 History   First MD Initiated Contact with Patient 08/05/15 2149     Chief Complaint  Patient presents with  . Animal Bite     (Consider location/radiation/quality/duration/timing/severity/associated sxs/prior Treatment) HPI Comments: Kaitlyn Costa is a 29 y.o. female who presents to the ED with complaints of provoked dog bite while at work earlier today around 7:30 PM. Dog is in the possession of her job, and is up-to-date on his rabies vaccinations. Patient reports that she was attempting to fix his collar when the dog bit her. Patient reports mild 1/10 constant throbbing at the site of the bite wound on the right index fingertip, nonradiating, worse with holding her arm with gravity, with no treatment tried prior to arrival. She denies any ongoing bleeding, numbness, tingling, weakness, or loss of range of motion. She is unsure of her last tetanus shot.  Patient is a [age] y.o. female presenting with animal bite. The history is provided by the patient. No language interpreter was used.  Animal Bite Contact animal:  Dog Location:  Finger Finger injury location:  R index finger Time since incident:  2 hours Pain details:    Quality: throb.   Severity:  Mild   Timing:  Constant   Progression:  Unchanged Incident location:  Work Provoked: provoked   Notifications:  None Animal's rabies vaccination status:  Up to date Animal in possession: yes   Tetanus status:  Unknown Relieved by:  None tried Exacerbated by: hanging finger with gravity. Ineffective treatments:  None tried Associated symptoms: no numbness and no swelling     History reviewed. No pertinent past medical history. History reviewed. No pertinent past surgical history. History reviewed. No pertinent family history. Social History  Substance Use Topics  . Smoking status: Never Smoker   . Smokeless tobacco: None  . Alcohol Use: Yes   OB History    No data  available     Review of Systems  Musculoskeletal: Positive for arthralgias (R index finger lac). Negative for myalgias and joint swelling.  Skin: Positive for wound.  Allergic/Immunologic: Negative for immunocompromised state.  Neurological: Negative for weakness and numbness.  Hematological: Does not bruise/bleed easily.   10 Systems reviewed and are negative for acute change except as noted in the HPI.    Allergies  Amoxicillin-pot clavulanate  Home Medications   Prior to Admission medications   Not on File   BP 142/99 mmHg  Pulse 72  Temp(Src) 98 F (36.7 C) (Oral)  Resp 16  Ht 5\' 8"  (1.727 m)  Wt 108 lb (48.988 kg)  BMI 16.42 kg/m2  SpO2 100% Physical Exam  Constitutional: She is oriented to person, place, and time. Vital signs are normal. She appears well-developed and well-nourished.  Non-toxic appearance. No distress.  Afebrile, nontoxic, NAD  HENT:  Head: Normocephalic and atraumatic.  Mouth/Throat: Mucous membranes are normal.  Eyes: Conjunctivae and EOM are normal. Right eye exhibits no discharge. Left eye exhibits no discharge.  Neck: Normal range of motion. Neck supple.  Cardiovascular: Normal rate and intact distal pulses.   Pulmonary/Chest: Effort normal. No respiratory distress.  Abdominal: Normal appearance. She exhibits no distension.  Musculoskeletal: Normal range of motion.       Right forearm: She exhibits laceration. She exhibits no tenderness, no bony tenderness and no swelling.  R index finger with FROM intact at MCP, PIP, and DIP joints, with no focal bony TTP, no deformity or crepitus,  with small skin tear wound to distal tip on palmar aspect, measuring approx 4mm in length. No deep puncture wounds, no retained FB, no ongoing bleeding. No swelling. Strength and sensation grossly intact, distal pulses intact, cap refill brisk and present.   Neurological: She is alert and oriented to person, place, and time. She has normal strength. No sensory  deficit.  Skin: Skin is warm and dry. Laceration noted. No rash noted.  R index finger lac as noted above  Psychiatric: She has a normal mood and affect. Her behavior is normal.  Nursing note and vitals reviewed.   ED Course  Procedures (including critical care time) Labs Review Labs Reviewed - No data to display  Imaging Review No results found. I have personally reviewed and evaluated these images and lab results as part of my medical decision-making.   EKG Interpretation None      MDM   Final diagnoses:  Dog bite  Finger wound, simple, open, initial encounter    30 y.o. female here with dog bite to R index finger. Small skin tear, no deep puncture wounds. FROM intact, NVI, no ongoing bleeding, no gaping wounds. No tenderness, doubt need for xray. Rabies UTD on animal. Will update tetanus. Will send home on doxy since pt is allergic to augmentin. F/up with PCP in 5 days for recheck. Wound care discussed. Tylenol/motrin for pain. I explained the diagnosis and have given explicit precautions to return to the ER including for any other new or worsening symptoms. The patient understands and accepts the medical plan as it's been dictated and I have answered their questions. Discharge instructions concerning home care and prescriptions have been given. The patient is STABLE and is discharged to home in good condition.  BP 142/99 mmHg  Pulse 72  Temp(Src) 98 F (36.7 C) (Oral)  Resp 16  Ht  (1.727 m)  Wt 108 lb (48.988 kg)  BMI 16.42 kg/m2  SpO2 100%  Meds ordered this encounter  Medications  . Tdap (BOOSTRIX) injection 0.5 mL    Sig:   . bacitracin ointment    Sig:   . doxycycline (VIBRAMYCIN) 100 MG capsule    Sig: Take 1 capsule (100 mg total) by mouth 2 (two) times daily. One po bid x 7 days    Dispense:  14 capsule    Refill:  0    Order Specific Question:  Supervising Provider    Answer:  Eber Hong [[id number]]       Via Rosado Camprubi-Soms, PA-C 08/05/15  1610  Mirian Mo, MD 08/08/15 1012

## 2015-08-05 NOTE — ED Notes (Signed)
Pt verbalizes understanding of d/c instructions and denies any further needs at this time. 

## 2015-08-05 NOTE — Discharge Instructions (Signed)
Keep wound clean with mild soap and water. Keep area covered with a topical antibiotic ointment and bandage, keep bandage dry. Ice and elevate for additional pain relief and swelling. Alternate between ibuprofen and Tylenol for additional pain relief. Take antibiotic as directed. Follow up with your primary care doctor or the [facility] in approximately 5 days for wound recheck. Monitor area for signs of infection to include, but not limited to: increasing pain, spreading redness, drainage/pus, worsening swelling, or fevers. Return to emergency department for emergent changing or worsening symptoms.    Wound Care Taking care of your wound properly can help to prevent pain and infection. It can also help your wound to heal more quickly.  HOW TO CARE FOR YOUR WOUND  Take or apply over-the-counter and prescription medicines only as told by your health care provider.  If you were prescribed antibiotic medicine, take or apply it as told by your health care provider. Do not stop using the antibiotic even if your condition improves.  Clean the wound each day or as told by your health care provider.  Wash the wound with mild soap and water.  Rinse the wound with water to remove all soap.  Pat the wound dry with a clean towel. Do not rub it.  There are many different ways to close and cover a wound. For example, a wound can be covered with stitches (sutures), skin glue, or adhesive strips. Follow instructions from your health care provider about:  How to take care of your wound.  When and how you should change your bandage (dressing).  When you should remove your dressing.  Removing whatever was used to close your wound.  Check your wound every day for signs of infection. Watch for:  Redness, swelling, or pain.  Fluid, blood, or pus.  Keep the dressing dry until your health care provider says it can be removed. Do not take baths, swim, use a hot tub, or do anything that  would put your wound underwater until your health care provider approves.  Raise (elevate) the injured area above the level of your heart while you are sitting or lying down.  Do not scratch or pick at the wound.  Keep all follow-up visits as told by your health care provider. This is important. SEEK MEDICAL CARE IF:  You received a tetanus shot and you have swelling, severe pain, redness, or bleeding at the injection site.  You have a fever.  Your pain is not controlled with medicine.  You have increased redness, swelling, or pain at the site of your wound.  You have fluid, blood, or pus coming from your wound.  You notice a bad smell coming from your wound or your dressing. SEEK IMMEDIATE MEDICAL CARE IF:  You have a red streak going away from your wound.   This information is not intended to replace advice given to you by your health care provider. Make sure you discuss any questions you have with your health care provider.   Document Released: 06/29/2008 Document Revised: 02/04/2015 Document Reviewed: 09/16/2014 Elsevier Interactive Patient Education [employer]2016 Elsevier Inc.

## 2015-08-05 NOTE — ED Notes (Signed)
Patient states that she was bit by a dog at work. Rabies up to date

## 2015-08-07 ENCOUNTER — Telehealth: Payer: Self-pay | Admitting: Emergency Medicine

## 2015-12-17 ENCOUNTER — Emergency Department (HOSPITAL_COMMUNITY)
Admission: EM | Admit: 2015-12-17 | Discharge: 2015-12-17 | Disposition: A | Discharge status: Home / Self Care | Payer: Worker's Compensation | Attending: Emergency Medicine | Admitting: Emergency Medicine

## 2015-12-17 ENCOUNTER — Emergency Department (HOSPITAL_COMMUNITY): Payer: Worker's Compensation

## 2015-12-17 ENCOUNTER — Encounter (HOSPITAL_COMMUNITY): Payer: Self-pay

## 2015-12-17 DIAGNOSIS — R200 Anesthesia of skin: Secondary | ICD-10-CM | POA: Insufficient documentation

## 2015-12-17 DIAGNOSIS — M542 Cervicalgia: Secondary | ICD-10-CM

## 2015-12-17 MED ORDER — HYDROMORPHONE HCL 1 MG/ML IJ SOLN
1.0000 mg | Freq: Once | INTRAMUSCULAR | Status: AC
  Administered 2015-12-17: 1 mg via INTRAVENOUS
  Filled 2015-12-17: qty 1

## 2015-12-17 MED ORDER — NAPROXEN 500 MG PO TABS: 500.0000 mg | ORAL_TABLET | Freq: Two times a day (BID) | ORAL | Status: DC

## 2015-12-17 MED ORDER — CYCLOBENZAPRINE HCL 10 MG PO TABS: 10.0000 mg | ORAL_TABLET | Freq: Three times a day (TID) | ORAL | Status: DC | PRN

## 2015-12-17 MED ORDER — LORAZEPAM 2 MG/ML IJ SOLN
0.5000 mg | Freq: Once | INTRAMUSCULAR | Status: AC
  Administered 2015-12-17: 0.5 mg via INTRAVENOUS
  Filled 2015-12-17: qty 1

## 2015-12-17 MED ORDER — PROMETHAZINE HCL 25 MG/ML IJ SOLN
25.0000 mg | Freq: Once | INTRAMUSCULAR | Status: AC
  Administered 2015-12-17: 25 mg via INTRAVENOUS
  Filled 2015-12-17: qty 1

## 2015-12-17 MED ORDER — METHYLPREDNISOLONE SODIUM SUCC 125 MG IJ SOLR
125.0000 mg | Freq: Once | INTRAMUSCULAR | Status: AC
  Administered 2015-12-17: 125 mg via INTRAVENOUS
  Filled 2015-12-17: qty 2

## 2015-12-17 MED ORDER — ONDANSETRON HCL 4 MG/2ML IJ SOLN
4.0000 mg | Freq: Once | INTRAMUSCULAR | Status: AC
  Administered 2015-12-17: 4 mg via INTRAVENOUS
  Filled 2015-12-17: qty 2

## 2015-12-17 MED ORDER — HYDROCODONE-ACETAMINOPHEN 5-325 MG PO TABS: 1.0000 | ORAL_TABLET | Freq: Four times a day (QID) | ORAL | Status: DC | PRN

## 2015-12-17 NOTE — ED Notes (Signed)
Pt reports she hurt her neck at work on the 13 and says pain has been more intense since then.  Pt says hurts to move head up and down but can turn side to side.  Pt also reports tingling in r arm.

## 2015-12-17 NOTE — ED Provider Notes (Signed)
CSN: [id number]     Arrival date & time 12/17/15  4098 History  By signing my name below, I, Linna Darner, attest that this documentation has been prepared under the direction and in the presence of physician practitioner, Bethann Berkshire, MD,. Electronically Signed: Linna Darner, Scribe. 12/17/2015. 11:10 AM.    Chief Complaint  Patient presents with  . Neck Pain    Patient is a 30 y.o. female presenting with neck pain. The history is provided by the patient ( Patient complains of neck pain after wrestling with a dog). No language interpreter was used.  Neck Pain Pain location:  Occipital region Pain radiates to:  R shoulder Pain severity:  Severe Pain is:  Unable to specify Onset quality:  Sudden Duration:  2 days Timing:  Constant Chronicity:  New Context: recent injury   Relieved by:  None tried Worsened by:  Bending Ineffective treatments:  None tried Associated symptoms: numbness   Associated symptoms: no chest pain, no headaches and no weakness      HPI Comments: Kaitlyn Costa is a 30 y.o. female with no pertinent PMHx who presents to the Emergency Department complaining of sudden onset, constant, severe, neck pain beginning two days ago. 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 She states that she does not recall getting hit/bitten in her neck. She has been experiencing severe pain in the back of her neck radiating into her upper right back and her right shoulder. Pt states that she began experiencing muscle spasms in her right shoulder today. She states that she began experiencing intermittent numbness in her right arm and bilateral hands/fingers today as well. She notes that she felt like she "maxed out at the gym" yesterday morning and had a hard time moving; she went to work yesterday and notes that she experienced increased difficulty and pain while pushing or pulling things throughout the day. Pt  states that her body began "tightening up" yesterday. Pt notes severe pain exacerbation while trying to move her neck up and down; she states that it feels like she is going to have bowel incontinence if she tried to move her neck vertically. She is able to turn her neck side-to-side. She endorses moderate pain with palpation to the back of her neck. She endorses severe pain with palpation to her upper right back. Pt denies weakness or any other associated symptoms.  History reviewed. No pertinent past medical history. Past Surgical History  Procedure Laterality Date  . Skull surgery    . Shoulder surgery     No family history on file. Social History  Substance Use Topics  . Smoking status: Never Smoker   . Smokeless tobacco: None  . Alcohol Use: Yes     Comment: occ   OB History    No data available     Review of Systems  Constitutional: Negative for appetite change and fatigue.  HENT: Negative for congestion, ear discharge and sinus pressure.   Eyes: Negative for discharge.  Respiratory: Negative for cough.   Cardiovascular: Negative for chest pain.  Gastrointestinal: Negative for abdominal pain and diarrhea.  Genitourinary: Negative for frequency and hematuria.  Musculoskeletal: Positive for arthralgias and neck pain. Negative for back pain.  Skin: Negative for rash.  Neurological: Positive for numbness. Negative for seizures, weakness and headaches.  Psychiatric/Behavioral: Negative for hallucinations.      Allergies  Amoxicillin-pot clavulanate and Avelox  Home Medications  Prior to Admission medications   Medication Sig Start Date End Date Taking? Authorizing Provider  ibuprofen (ADVIL,MOTRIN) 200 MG tablet Take 200 mg by mouth every 6 (six) hours as needed.   Yes Historical Provider, MD  levonorgestrel (MIRENA) 20 MCG/24HR IUD 1 each by Intrauterine route once.   Yes Historical Provider, MD  magnesium 30 MG tablet Take 30 mg by mouth daily.   Yes Historical  Provider, MD  Multiple Vitamins-Minerals (HAIR SKIN AND NAILS FORMULA) TABS Take 1 tablet by mouth daily.   Yes Historical Provider, MD  doxycycline (VIBRAMYCIN) 100 MG capsule Take 1 capsule (100 mg total) by mouth 2 (two) times daily. One po bid x 7 days Patient not taking: Reported on 12/17/2015 08/05/15   Mercedes Camprubi-Soms, PA-C   BP 142/95 mmHg  Pulse 75  Temp(Src) 98.3 F (36.8 C)  Resp 18  SpO2 100% Physical Exam  Constitutional: She is oriented to person, place, and time. She appears well-developed.  HENT:  Head: Normocephalic.  Tender around c5 and c6 going down right upper back Pain with movement of neck  Eyes: Conjunctivae and EOM are normal. No scleral icterus.  Neck: Neck supple. No thyromegaly present.  Cardiovascular: Normal rate and regular rhythm.  Exam reveals no gallop and no friction rub.   No murmur heard. Pulmonary/Chest: No stridor. She has no wheezes. She has no rales. She exhibits no tenderness.  Abdominal: She exhibits no distension. There is no tenderness. There is no rebound.  Musculoskeletal: Normal range of motion. She exhibits no edema.  Lymphadenopathy:    She has no cervical adenopathy.  Neurological: She is oriented to person, place, and time. She exhibits normal muscle tone. Coordination normal.  Skin: No rash noted. No erythema.  Psychiatric: She has a normal mood and affect. Her behavior is normal.    ED Course  Procedures (including critical care time)  DIAGNOSTIC STUDIES: Oxygen Saturation is 100% on RA, normal by my interpretation.    COORDINATION OF CARE: 11:11 AM Will adminisrter Dilaudid, Ativan, and Solu-Medrol injections. Will order DG Cervical Spine Complete. Discussed treatment plan with pt at bedside and pt agreed to plan.   Labs Review Labs Reviewed - No data to display  Imaging Review Dg Cervical Spine Complete  12/17/2015  CLINICAL DATA:  Two day history of right-sided cervicalgia with right upper extremity radicular  symptoms EXAM: CERVICAL SPINE - COMPLETE 4+ VIEW COMPARISON:  None. FINDINGS: Frontal, lateral, open-mouth odontoid, and bilateral oblique views were obtained. There is no appreciable fracture or spondylolisthesis. Prevertebral soft tissues and predental space regions are normal. The disc spaces appear normal. There is mild facet hypertrophy on the right at C3-4. Other facets appear normal. IMPRESSION: Facet hypertrophy at C3-4 on the right. No appreciable disc space narrowing. No fracture or spondylolisthesis. Electronically Signed   By: Bretta Bang III M.D.   On: 12/17/2015 09:41   I have personally reviewed and evaluated these images and lab results as part of my medical decision-making.   EKG Interpretation None      MDM   Final diagnoses:  None    patient with neck pain and radicular pain down her right arm. MRI of the neck does not show any significant disc problems. Will treat patient with muscle relaxer nonsteroidal and pain medicines. She will follow-up with orthopedic next week  The chart was scribed for me under my direct supervision.  I personally performed the history, physical, and medical decision making and all procedures in the evaluation of  this patient.Bethann Berkshire.      Chelsea Pedretti, MD 12/17/15 1440

## 2016-08-09 ENCOUNTER — Ambulatory Visit (INDEPENDENT_AMBULATORY_CARE_PROVIDER_SITE_OTHER): Payer: BLUE CROSS/BLUE SHIELD | Admitting: Obstetrics and Gynecology

## 2016-08-09 ENCOUNTER — Encounter: Payer: Self-pay | Admitting: Obstetrics and Gynecology

## 2016-08-09 ENCOUNTER — Encounter: Payer: Self-pay | Admitting: Nurse Practitioner

## 2016-08-09 VITALS — BP 120/78 | HR 68 | Resp 16 | Ht 65.5 in | Wt 209.0 lb

## 2016-08-09 DIAGNOSIS — R635 Abnormal weight gain: Secondary | ICD-10-CM

## 2016-08-09 DIAGNOSIS — Z01419 Encounter for gynecological examination (general) (routine) without abnormal findings: Secondary | ICD-10-CM | POA: Diagnosis not present

## 2016-08-09 DIAGNOSIS — Z124 Encounter for screening for malignant neoplasm of cervix: Secondary | ICD-10-CM | POA: Diagnosis not present

## 2016-08-09 DIAGNOSIS — Z30432 Encounter for removal of intrauterine contraceptive device: Secondary | ICD-10-CM

## 2016-08-09 DIAGNOSIS — Z8639 Personal history of other endocrine, nutritional and metabolic disease: Secondary | ICD-10-CM

## 2016-08-09 DIAGNOSIS — Z0000 Encounter for general adult medical examination without abnormal findings: Secondary | ICD-10-CM

## 2016-08-09 DIAGNOSIS — L680 Hirsutism: Secondary | ICD-10-CM | POA: Diagnosis not present

## 2016-08-09 LAB — CBC
HCT: 45.6 % — ABNORMAL HIGH (ref 35.0–45.0)
Hemoglobin: 15.3 g/dL (ref 11.7–15.5)
MCH: 29.7 pg (ref 27.0–33.0)
MCHC: 33.6 g/dL (ref 32.0–36.0)
MCV: 88.4 fL (ref 80.0–100.0)
MPV: 9.2 fL (ref 7.5–12.5)
Platelets: 297 10*3/uL (ref 140–400)
RBC: 5.16 MIL/uL — ABNORMAL HIGH (ref 3.80–5.10)
RDW: 12.7 % (ref 11.0–15.0)
WBC: 7.4 10*3/uL (ref 3.8–10.8)

## 2016-08-09 LAB — COMPREHENSIVE METABOLIC PANEL
ALT: 44 U/L — ABNORMAL HIGH (ref 6–29)
AST: 24 U/L (ref 10–30)
Albumin: 4.8 g/dL (ref 3.6–5.1)
Alkaline Phosphatase: 39 U/L (ref 33–115)
BUN: 14 mg/dL (ref 7–25)
CO2: 25 mmol/L (ref 20–31)
Calcium: 9.3 mg/dL (ref 8.6–10.2)
Chloride: 101 mmol/L (ref 98–110)
Creat: 0.65 mg/dL (ref 0.50–1.10)
Glucose, Bld: 129 mg/dL — ABNORMAL HIGH (ref 65–99)
Potassium: 4 mmol/L (ref 3.5–5.3)
Sodium: 136 mmol/L (ref 135–146)
Total Bilirubin: 0.5 mg/dL (ref 0.2–1.2)
Total Protein: 7.1 g/dL (ref 6.1–8.1)

## 2016-08-09 LAB — TSH: TSH: 3.13 mIU/L

## 2016-08-09 LAB — LIPID PANEL
Cholesterol: 186 mg/dL (ref <200)
HDL: 69 mg/dL (ref >50)
LDL Cholesterol: 100 mg/dL — ABNORMAL HIGH
Total CHOL/HDL Ratio: 2.7 Ratio (ref <5.0)
Triglycerides: 84 mg/dL (ref <150)
VLDL: 17 mg/dL (ref <30)

## 2016-08-09 LAB — HEMOGLOBIN A1C
Hgb A1c MFr Bld: 7.4 % — ABNORMAL HIGH (ref <5.7)
Mean Plasma Glucose: 166 mg/dL

## 2016-08-09 NOTE — Patient Instructions (Signed)
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
## 2016-08-10 ENCOUNTER — Telehealth: Payer: Self-pay | Admitting: *Deleted

## 2016-08-10 LAB — DHEA-SULFATE: DHEA-SO4: 200 ug/dL (ref 23–266)

## 2016-08-10 LAB — VITAMIN D 25 HYDROXY (VIT D DEFICIENCY, FRACTURES): Vit D, 25-Hydroxy: 25 ng/mL — ABNORMAL LOW (ref 30–100)

## 2016-08-10 NOTE — Telephone Encounter (Signed)
-----   Message from Romualdo BolkJill Evelyn Jertson, MD sent at 08/10/2016  9:42 AM EST ----- Please also advise the patient that she should not get pregnant prior to her diabetes being under control. It increases the risk of birth defects and complications with a pregnancy

## 2016-08-10 NOTE — Telephone Encounter (Signed)
Return call to Elaine. °

## 2016-08-10 NOTE — Telephone Encounter (Signed)
Spoke with patient and gave results. Advised patient to set up with PCP ASAP to get sugars and LFTs under control. Also advised per Dr. Oscar LaJertson that she should wait on trying to get pregnant until after her sugars are better controlled. Patient voiced understanding and will call back with a PCP name so we can fax lab results -eh

## 2016-08-10 NOTE — Telephone Encounter (Signed)
Left message to call regarding lab results -eh 

## 2016-08-11 LAB — IPS PAP TEST WITH HPV

## 2016-08-11 LAB — 17-HYDROXYPROGESTERONE: 17-OH-Progesterone, LC/MS/MS: 40 ng/dL

## 2016-08-11 LAB — TESTOSTERONE, TOTAL, LC/MS/MS: Testosterone, Total, LC-MS-MS: 51 ng/dL — ABNORMAL HIGH (ref 2–45)

## 2017-03-23 ENCOUNTER — Telehealth: Payer: Self-pay | Admitting: Obstetrics and Gynecology

## 2017-03-23 NOTE — Telephone Encounter (Signed)
Amenorrhea is a reason for evaluation, including a history, partial exam, lab work and medication. This can not be done over the phone. It is not normal to not have a cycle.

## 2017-03-23 NOTE — Telephone Encounter (Signed)
Patient is having problems with her cycle after having her IUD removed.

## 2017-03-23 NOTE — Telephone Encounter (Signed)
Spoke with patient. Patient states Mirena IUD removed 08/2016, no cycle since April 2018, has not been SA since December 2018, no chance of pregnancy. Patient denies any other GYN complaints. Reports intentional weight loss and increased stress at work. Recommended OV for further evaluation, patient declines. Patient verbalized that she would like reason for OV, not just to come in and talk. Patient states she was seen 7 months ago, feels that an OV is a waste of time if nothing can be done. 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 Advised patient OV is further evaluation and discussion with provider to determine cause of missed cycles; triage is to assist patient and provider with scheduling appropriately to ensure timely evaluation. Advised patient would review with provider and return call with recommendations. Patient states she wants to know exactly what will be done if OV still recommended, leave detailed message if no answer.    Dr. Oscar LaJertson, please review and advise?

## 2017-03-25 NOTE — Telephone Encounter (Signed)
Patient states that she has not been sexually active since 09/2016. "This would be a waste of money if I do this or have it in the office because I am not sexually active."  Routing to provider for final review. Patient agreeable to disposition. Will close encounter.

## 2017-03-25 NOTE — Telephone Encounter (Signed)
Please make sure she has checked a UPT. Then close the encouter

## 2017-03-25 NOTE — Telephone Encounter (Signed)
Spoke with patient. Advised of message as seen below from Dr.Jertson. Patient states she thinks she was misunderstood. States she had a menses every month while she had her Mirena IUD in placed. Since removal in 08/2016 has had a cycle every month until May 2018. "I have missed my May cycle and now June. I don't want to come in just to talk. I wanted to know what would be done." Again advised of message as seen below from Dr.Jertson. Offered appointment. Patient declines. States she would like to monitor for 1 more month and if she does not have menses will call to schedule an appointment. Aware of the importance of seeking evaluation for missed menses as this is not normal. Patient verbalizes understanding.

## 2017-08-01 ENCOUNTER — Telehealth: Payer: Self-pay | Admitting: Obstetrics and Gynecology

## 2017-08-01 NOTE — Telephone Encounter (Signed)
Left message to call Kedar Sedano at [phone]. 

## 2017-08-01 NOTE — Telephone Encounter (Signed)
Patient had a positive pregnancy test and is having some cramping. Patient  want to know what type of labs she would need because she will be paying out of pocket.

## 2017-08-08 NOTE — Telephone Encounter (Signed)
Left message to call Kylar Speelman at [phone]. 

## 2017-08-11 ENCOUNTER — Ambulatory Visit: Payer: BLUE CROSS/BLUE SHIELD | Admitting: Obstetrics and Gynecology

## 2017-08-12 NOTE — Telephone Encounter (Signed)
Routing to Dr.Jertson for review. Okay to close?

## 2017-09-22 ENCOUNTER — Encounter (HOSPITAL_COMMUNITY): Payer: Self-pay | Admitting: *Deleted

## 2017-09-22 ENCOUNTER — Other Ambulatory Visit: Payer: Self-pay

## 2017-09-22 ENCOUNTER — Inpatient Hospital Stay (HOSPITAL_COMMUNITY)
Admission: AD | Admit: 2017-09-22 | Discharge: 2017-09-22 | Disposition: A | Discharge status: Home / Self Care | Payer: Self-pay | Source: Ambulatory Visit | Attending: Family Medicine | Admitting: Family Medicine

## 2017-09-22 ENCOUNTER — Inpatient Hospital Stay (HOSPITAL_COMMUNITY): Payer: Self-pay

## 2017-09-22 DIAGNOSIS — O021 Missed abortion: Secondary | ICD-10-CM | POA: Insufficient documentation

## 2017-09-22 DIAGNOSIS — Z881 Allergy status to other antibiotic agents status: Secondary | ICD-10-CM | POA: Insufficient documentation

## 2017-09-22 DIAGNOSIS — Z880 Allergy status to penicillin: Secondary | ICD-10-CM | POA: Insufficient documentation

## 2017-09-22 DIAGNOSIS — Z3A09 9 weeks gestation of pregnancy: Secondary | ICD-10-CM | POA: Insufficient documentation

## 2017-09-22 DIAGNOSIS — O209 Hemorrhage in early pregnancy, unspecified: Secondary | ICD-10-CM

## 2017-09-22 DIAGNOSIS — O034 Incomplete spontaneous abortion without complication: Secondary | ICD-10-CM

## 2017-09-22 LAB — CBC
HCT: 38.4 % (ref 36.0–46.0)
Hemoglobin: 13.5 g/dL (ref 12.0–15.0)
MCH: 29.7 pg (ref 26.0–34.0)
MCHC: 35.2 g/dL (ref 30.0–36.0)
MCV: 84.6 fL (ref 78.0–100.0)
Platelets: 266 10*3/uL (ref 150–400)
RBC: 4.54 MIL/uL (ref 3.87–5.11)
RDW: 12.3 % (ref 11.5–15.5)
WBC: 10.2 10*3/uL (ref 4.0–10.5)

## 2017-09-22 LAB — WET PREP, GENITAL
Clue Cells Wet Prep HPF POC: NONE SEEN
Sperm: NONE SEEN
Trich, Wet Prep: NONE SEEN
Yeast Wet Prep HPF POC: NONE SEEN

## 2017-09-22 LAB — URINALYSIS, ROUTINE W REFLEX MICROSCOPIC
Bacteria, UA: NONE SEEN
Bilirubin Urine: NEGATIVE
Glucose, UA: NEGATIVE mg/dL
Ketones, ur: NEGATIVE mg/dL
Leukocytes, UA: NEGATIVE
Nitrite: NEGATIVE
Protein, ur: NEGATIVE mg/dL
Specific Gravity, Urine: 1.017 (ref 1.005–1.030)
pH: 6 (ref 5.0–8.0)

## 2017-09-22 LAB — ABO/RH: ABO/RH(D): A POS

## 2017-09-22 MED ORDER — PROMETHAZINE HCL 25 MG PO TABS: 25.0000 mg | ORAL_TABLET | Freq: Four times a day (QID) | ORAL | 0 refills | Status: DC | PRN

## 2017-09-22 MED ORDER — IBUPROFEN 600 MG PO TABS: 600.0000 mg | ORAL_TABLET | Freq: Four times a day (QID) | ORAL | 0 refills | Status: DC | PRN

## 2017-09-22 MED ORDER — MISOPROSTOL 200 MCG PO TABS: 600.0000 ug | ORAL_TABLET | Freq: Once | ORAL | 0 refills | Status: DC

## 2017-09-22 MED ORDER — OXYCODONE-ACETAMINOPHEN 5-325 MG PO TABS: 2.0000 | ORAL_TABLET | ORAL | 0 refills | Status: DC | PRN

## 2017-09-22 NOTE — MAU Provider Note (Signed)
History     CSN: [id number]  Arrival date and time: 09/22/17 1757   First Provider Initiated Contact with Patient 09/22/17 2048      Chief Complaint  Patient presents with  . Vaginal Bleeding   HPI Kaitlyn Costa 31 y.o. [id number] Comes to MAU with vaginal bleeding since Saturday.  [place] and it was very suspicious for fetal demise but it was not confirmed.  So she is here and wondering if this is a miscarriage.  Reviewed US report in Care Everywhere.   Reviewed client's [facility] blood card - blood type is A positive.   OB History    Gravida Para Term Preterm AB Living   2       1     SAB TAB Ectopic Multiple Live Births       1          Past Medical History:  Diagnosis Date  . Dysmenorrhea   . Ectopic pregnancy    treated with medication  . Herniated disc, cervical   . History of diabetes mellitus   . History of genital warts   . HPV in female     Past Surgical History:  Procedure Laterality Date  . CRANIOTOMY    . SHOULDER SURGERY    . skull surgery    . TONSILLECTOMY      History reviewed. No pertinent family history.  Social History   Tobacco Use  . Smoking status: Never Smoker  . Smokeless tobacco: Never Used  Substance Use Topics  . Alcohol use: Yes    Alcohol/week: 1.2 - 1.8 oz    Types: 2 - 3 Standard drinks or equivalent per week  . Drug use: No    Allergies:  Allergies  Allergen Reactions  . Augmentin [Amoxicillin-Pot Clavulanate] Shortness Of Breath and Rash  . Amoxicillin-Pot Clavulanate   . Avelox [Moxifloxacin Hcl In Nacl] Itching and Swelling    Medications Prior to Admission  Medication Sig Dispense Refill Last Dose  . cholecalciferol (VITAMIN D) 1000 units tablet Take 1,000 Units by mouth daily.   09/22/2017 at Unknown time  . Prenatal Vit-Fe Fumarate-FA (PRENATAL MULTIVITAMIN) TABS tablet Take 1 tablet by mouth daily at 12 noon.   09/22/2017 at Unknown time  . levonorgestrel (MIRENA) 20  MCG/24HR IUD 1 each by Intrauterine route once.   Taking  . Misc Natural Products (HIMALAYAN GOJI PO) Take by mouth.   Taking  . Specialty Vitamins Products (BIOTIN PLUS KERATIN PO) Take by mouth.   Taking    Review of Systems  Constitutional: Negative for fever.  Gastrointestinal: Negative for abdominal pain.  Genitourinary: Positive for vaginal bleeding. Negative for dysuria and vaginal discharge.  Musculoskeletal: Negative for back pain.   Physical Exam   Blood pressure 137/84, pulse 80, temperature 98.4 F (36.9 C), temperature source Oral, resp. rate 18, weight 193 lb (87.5 kg), last menstrual period 06/21/2017, SpO2 100 %.  Physical Exam  Nursing note and vitals reviewed. Constitutional: She is oriented to person, place, and time. She appears well-developed and well-nourished.  HENT:  Head: Normocephalic.  Eyes: EOM are normal.  Neck: Neck supple.  GI: Soft. There is no tenderness. There is no rebound and no guarding.  Genitourinary:  Genitourinary Comments: Speculum exam: Vagina - Moderate amount of dark blood Cervix - small amount of active bleeding Bimanual exam: Cervix closed Uterus non tender, enlarged Adnexa non tender, no masses bilaterally GC/Chlam, wet prep done Chaperone present for  exam.   Musculoskeletal: Normal range of motion.  Neurological: She is alert and oriented to person, place, and time.  Skin: Skin is warm and dry.  Psychiatric: She has a normal mood and affect.    MAU Course  Procedures CLINICAL DATA:  Vaginal bleeding, previous ultrasound demonstrating no fetal heart tone  EXAM: OBSTETRIC <14 WK US AND TRANSVAGINAL OB US  TECHNIQUE: Both transabdominal and transvaginal ultrasound examinations were performed for complete evaluation of the gestation as well as the maternal uterus, adnexal regions, and pelvic cul-de-sac. Transvaginal technique was performed to assess early pregnancy.  COMPARISON:  None.  FINDINGS: Intrauterine  gestational sac: Visible  Yolk sac:  Not visible  Embryo:  Visible  Cardiac Activity: Not visible  CRL:  24  mm   9 w   0 d                  US EDC: 04/27/2018  Subchorionic hemorrhage:  None visualized.  Maternal uterus/adnexae: Ovaries are within normal limits. Right ovary measures 2.7 x 2 x 1.6 cm. Left ovary measures 2.2 x 1.3 x 1.8 cm  IMPRESSION: Single intrauterine pregnancy with embryo identified. Average crown-rump length is 24 mm and there is no fetal cardiac activity identified. Findings meet definitive criteria for failed pregnancy. This follows SRU consensus guidelines: Diagnostic Criteria for Nonviable Pregnancy Early in the First Trimester. Macy Mis Engl J Med [id number];369:1443-51.  MDM Discussed ultrasound findings with client and her family.  She was very tearful.  questions answered for her.  Discussed cytotec vs expectant management.  Client requests cytotec management.  Reviewed with Dr. Adrian BlackwaterStinson who is in agreement.  Assessment and Plan  Fetal demise at 9 weeks, first encounter and follow up needed  Plan Will prescribe cytotec to her pharmacy - to use bucally and will have medication for pain management. Message sent to clinic to follow up for miscarriage in 2 weeks. Return to MAU for severe vaginal bleeding or fever. Reviewed instructions on how to use cytotec bucally with client. After blood is drawn, client may go home.  Elisandro Jarrett L Skii Cleland 09/22/2017, 9:01 PM

## 2017-09-22 NOTE — MAU Note (Signed)
[facility] [place].  Was unable to see anything definite preg, when wanded could see flickering but not a strong heartbeat, measurements were normal.  Was having a brownish d/c prior to 'wanding', yesterday she started hemorrhaging, some brown clots.  Has had brown blood off and on, when at work, passed a quarter sized clots. Has also been constipated, strained with BM- that is when the brownish d/c started. Had picture of pad, scant line. No fever, no pain or cramping. Is urinating frequently, small amts.  a1c was 7.2.  By her HCG level in Nov, they told her she was 4-5wks, US measured at 9wks.

## 2017-09-22 NOTE — Discharge Instructions (Signed)
Get medications from your pharmacy.  Take the cytotec as we discussed. Expect the clinic to call you to schedule an appointment to be seen for follow up for miscarriage in 2 weeks. Return to MAU for severe vaginal bleeding or fever.  FACTS YOU SHOULD KNOW  WHAT IS AN EARLY PREGNANCY FAILURE? Once the egg is fertilized with the sperm and begins to develop, it attaches to the lining of the uterus. This early pregnancy tissue may not develop into an embryo (the beginning stage of a baby). Sometimes an embryo does develop but does not continue to grow. These problems can be seen on ultrasound.   MANAGEMNT OF EARLY PREGNANCY FAILURE: About 4 out of 100 (0.25%) women will have a pregnancy loss in her lifetime.  One in five pregnancies is found to be an early pregnancy failure.  There are 3 ways to care for an early pregnancy failure:   (1) Surgery, (2) Medicine, (3) Waiting for you to pass the pregnancy on your own. The decision as to how to proceed after being diagnosed with and early pregnancy failure is an individual one.  The decision can be made only after appropriate counseling.  You need to weigh the pros and cons of the 3 choices. Then you can make the choice that works for you. SURGERY (D&E)  Procedure over in 1 day  Requires being put to sleep  Bleeding may be light  Possible problems during surgery, including injury to womb(uterus)  Care provider has more control Medicine (CYTOTEC)  The complete procedure may take days to weeks  No Surgery  Bleeding may be heavy at times  There may be drug side effects  Patient has more control Waiting  You may choose to wait, in which case your own body may complete the passing of the abnormal early pregnancy on its own in about 2-4 weeks  Your bleeding may be heavy at times  There is a small possibility that you may need surgery if the bleeding is too much or not all of the pregnancy has passed. CYTOTEC MANAGEMENT Prostaglandins  (cytotec) are the most widely used drug for this purpose. They cause the uterus to cramp and contract. You will place the medicine yourself inside your vagina in the privacy of your home. Empting of the uterus should occur within 3 days but the process may continue for several weeks. The bleeding may seem heavy at times. POSSIBLE SIDE EFFECTS FROM CYTOTEC  Nausea   Vomiting  Diarrhea Fever  Chills  Hot Flashes Side effects  from the process of the early pregnancy failure include:  Cramping  Bleeding  Headaches  Dizziness RISKS: This is a low risk procedure. Less than 1 in 100 women has a complication. An incomplete passage of the early pregnancy may occur. Also, Hemorrhage (heavy bleeding) could happen.  Rarely the pregnancy will not be passed completely. Excessively heavy bleeding may occur.  Your doctor may need to perform surgery to empty the uterus (D&E). Afterwards: Everybody will feel differently after the early pregnancy completion. You may have soreness or cramps for a day or two. You may have soreness or cramps for day or two.  You may have light bleeding for up to 2 weeks. You may be as active as you feel like being. If you have any of the following problems you may call Maternity Admissions Unit at [phone].  If you have pain that does not get better  with pain medication  Bleeding that soaks through 2 thick full-sized  sanitary pads in an hour  Cramps that last longer than 2 days  Foul smelling discharge  Fever above 100.4 degrees F Even if you do not have any of these symptoms, you should have a follow-up exam to make sure you are healing properly. This appointment will be made for you before you leave the hospital. Your next normal period will start again in 4-6 week after the loss. You can get pregnant soon after the loss, so use birth control right away. Finally: Make sure all your questions are answered before during and after any procedure. Follow up with medical  care and family planning methods.

## 2017-09-23 LAB — GC/CHLAMYDIA PROBE AMP (~~LOC~~) NOT AT ARMC
Chlamydia: NEGATIVE
Neisseria Gonorrhea: NEGATIVE

## 2017-09-29 ENCOUNTER — Encounter: Payer: Self-pay | Admitting: Student

## 2017-09-29 ENCOUNTER — Other Ambulatory Visit: Payer: Self-pay | Admitting: Student

## 2017-09-29 DIAGNOSIS — O039 Complete or unspecified spontaneous abortion without complication: Secondary | ICD-10-CM

## 2017-09-29 MED ORDER — MISOPROSTOL 200 MCG PO TABS: ORAL_TABLET | ORAL | 1 refills | Status: DC

## 2017-09-29 NOTE — Progress Notes (Signed)
Pt called stating minimal bleeding since cytotec dose 1 week ago. Reviewed labs, ultrasound, & provider note from her MAU visit on 12/20. Will rx 2nd dose of cytotec until her miscarriage follow up appointment.   Judeth HornLawrence, Jaymian Bogart, NP

## 2017-10-02 ENCOUNTER — Inpatient Hospital Stay (HOSPITAL_COMMUNITY): Payer: Self-pay

## 2017-10-02 ENCOUNTER — Encounter (HOSPITAL_COMMUNITY): Payer: Self-pay

## 2017-10-02 ENCOUNTER — Inpatient Hospital Stay (HOSPITAL_COMMUNITY)
Admission: AD | Admit: 2017-10-02 | Discharge: 2017-10-02 | Disposition: A | Discharge status: Home / Self Care | Payer: Self-pay | Source: Ambulatory Visit | Attending: Obstetrics & Gynecology | Admitting: Obstetrics & Gynecology

## 2017-10-02 DIAGNOSIS — Z79899 Other long term (current) drug therapy: Secondary | ICD-10-CM | POA: Insufficient documentation

## 2017-10-02 DIAGNOSIS — O039 Complete or unspecified spontaneous abortion without complication: Secondary | ICD-10-CM | POA: Insufficient documentation

## 2017-10-02 DIAGNOSIS — Z3A14 14 weeks gestation of pregnancy: Secondary | ICD-10-CM | POA: Insufficient documentation

## 2017-10-02 LAB — CBC
HCT: 41.1 % (ref 36.0–46.0)
Hemoglobin: 14.3 g/dL (ref 12.0–15.0)
MCH: 29.4 pg (ref 26.0–34.0)
MCHC: 34.8 g/dL (ref 30.0–36.0)
MCV: 84.4 fL (ref 78.0–100.0)
Platelets: 230 10*3/uL (ref 150–400)
RBC: 4.87 MIL/uL (ref 3.87–5.11)
RDW: 12.1 % (ref 11.5–15.5)
WBC: 14.4 10*3/uL — ABNORMAL HIGH (ref 4.0–10.5)

## 2017-10-02 MED ORDER — NALBUPHINE HCL 10 MG/ML IJ SOLN
10.0000 mg | Freq: Once | INTRAMUSCULAR | Status: AC
  Administered 2017-10-02: 10 mg via INTRAMUSCULAR
  Filled 2017-10-02: qty 1

## 2017-10-02 MED ORDER — ONDANSETRON 4 MG PO TBDP
4.0000 mg | ORAL_TABLET | Freq: Once | ORAL | Status: AC
  Administered 2017-10-02: 4 mg via ORAL
  Filled 2017-10-02: qty 1

## 2017-10-02 MED ORDER — ONDANSETRON HCL 4 MG PO TABS: 4.0000 mg | ORAL_TABLET | Freq: Four times a day (QID) | ORAL | 0 refills | Status: DC

## 2017-10-02 MED ORDER — PROMETHAZINE HCL 25 MG/ML IJ SOLN
12.5000 mg | Freq: Once | INTRAMUSCULAR | Status: AC
  Administered 2017-10-02: 12.5 mg via INTRAMUSCULAR
  Filled 2017-10-02: qty 1

## 2017-10-02 NOTE — MAU Note (Signed)
Patient presents after 2 rounds of cytotec, recent one last night, small amount of bleeding however having severe pain 10/10 which is making her vomit.

## 2017-10-02 NOTE — Discharge Instructions (Signed)

## 2017-10-02 NOTE — MAU Provider Note (Signed)
Chief Complaint: Abdominal Pain; Emesis; and Miscarriage   First Provider Initiated Contact with Patient 10/02/17 1409      SUBJECTIVE HPI: Kaitlyn Costa is a 31 y.o. G2P0010 at [redacted]w[redacted]d by LMP who presents to maternity admissions reporting incomplete miscarriage, abdominal pain and emesis. She was seen on 12/20 where she was found to have a fetal demise at 9 weeks. Was prescribed buccal cytotec. After cytotec produced no bleeding or cramping she was prescribed a second dose of cytotec to be placed vaginally on 12/27. She present today for abdominal pain that started this morning, she rates pain a 10/10- was trying to take pain medication that she was prescribed but vomited it back up. Describes pain as "worse than period pain and feels like she has no control on the pain". She denies vaginal bleeding, vaginal itching/burning, urinary symptoms, h/a, dizziness, n/v, or fever/chills.     Past Medical History:  Diagnosis Date  . Dysmenorrhea   . Ectopic pregnancy    treated with medication  . Herniated disc, cervical   . History of diabetes mellitus   . History of genital warts   . HPV in female    Past Surgical History:  Procedure Laterality Date  . CRANIOTOMY    . SHOULDER SURGERY    . skull surgery    . TONSILLECTOMY     Social History   Socioeconomic History  . Marital status: Single    Spouse name: Not on file  . Number of children: Not on file  . Years of education: Not on file  . Highest education level: Not on file  Social Needs  . Financial resource strain: Not on file  . Food insecurity - worry: Not on file  . Food insecurity - inability: Not on file  . Transportation needs - medical: Not on file  . Transportation needs - non-medical: Not on file  Occupational History  . Not on file  Tobacco Use  . Smoking status: Never Smoker  . Smokeless tobacco: Never Used  Substance and Sexual Activity  . Alcohol use: Yes    Alcohol/week: 1.2 - 1.8 oz    Types: 2 - 3  Standard drinks or equivalent per week  . Drug use: No  . Sexual activity: Yes    Partners: Male  Other Topics Concern  . Not on file  Social History Narrative  . Not on file   No current facility-administered medications on file prior to encounter.    Current Outpatient Medications on File Prior to Encounter  Medication Sig Dispense Refill  . cholecalciferol (VITAMIN D) 1000 units tablet Take 1,000 Units by mouth daily.    Marland Kitchen ibuprofen (ADVIL,MOTRIN) 600 MG tablet Take 1 tablet (600 mg total) by mouth every 6 (six) hours as needed. 30 tablet 0  . levonorgestrel (MIRENA) 20 MCG/24HR IUD 1 each by Intrauterine route once.    . Misc Natural Products (HIMALAYAN GOJI PO) Take by mouth.    . misoprostol (CYTOTEC) 200 MCG tablet Take 3 tablets (600 mcg total) by mouth once for 1 dose. Place in buccal gum line to absorb and swallow. 3 tablet 0  . oxyCODONE-acetaminophen (PERCOCET/ROXICET) 5-325 MG tablet Take 2 tablets by mouth every 4 (four) hours as needed for severe pain. 6 tablet 0  . Prenatal Vit-Fe Fumarate-FA (PRENATAL MULTIVITAMIN) TABS tablet Take 1 tablet by mouth daily at 12 noon.    . promethazine (PHENERGAN) 25 MG tablet Take 1 tablet (25 mg total) by mouth every 6 (six) hours  as needed for nausea or vomiting. 15 tablet 0  . Specialty Vitamins Products (BIOTIN PLUS KERATIN PO) Take by mouth.     Allergies  Allergen Reactions  . Augmentin [Amoxicillin-Pot Clavulanate] Shortness Of Breath and Rash  . Amoxicillin-Pot Clavulanate   . Avelox [Moxifloxacin Hcl In Nacl] Itching and Swelling    ROS:  Review of Systems  Constitutional: Negative.   Respiratory: Negative.   Cardiovascular: Negative.   Gastrointestinal: Positive for abdominal pain and vomiting. Negative for constipation, diarrhea and nausea.  Genitourinary: Negative.   Musculoskeletal: Negative.   Neurological: Negative.   Psychiatric/Behavioral: Negative.    I have reviewed patient's Past Medical Hx, Surgical Hx,  Family Hx, Social Hx, medications and allergies.   Physical Exam   Patient Vitals for the past 24 hrs:  BP Temp Pulse Resp SpO2  10/02/17 1623 117/81 - (!) 104 - -  10/02/17 1347 137/66 97.7 F (36.5 C) 89 20 100 %   Constitutional: Well-developed, well-nourished female in severe distress from pain.  Cardiovascular: normal rate with mild pulse elevation  Respiratory: normal effort, hyperventilating  GI: Abd soft, non-tender. Pos BS x 4 MS: Extremities nontender, no edema, normal ROM Neurologic: Alert and oriented x 4.  GU: Neg CVAT.  Before speculum exam patient stood up to reposition on bed and gushes of blood presented floor, large amount of blood running down leg  PELVIC EXAM: Cervix pink, visually open 2cm, without lesion, large bright red blood from cervical os and coating walls, assessment with faux swabs noticed embryo and tissue protruding through cervix at os, embryo and tissue removed with ring forceps and placed in path container, vaginal walls and external genitalia normal Bimanual exam: Cervix 2cm/long/high, firm, anterior, neg CMT, uterus tender, nonenlarged, adnexa without tenderness, enlargement, or mass  LAB RESULTS Results for orders placed or performed during the hospital encounter of 10/02/17 (from the past 24 hour(s))  CBC     Status: Abnormal   Collection Time: 10/02/17  3:02 PM  Result Value Ref Range   WBC 14.4 (H) 4.0 - 10.5 K/uL   RBC 4.87 3.87 - 5.11 MIL/uL   Hemoglobin 14.3 12.0 - 15.0 g/dL   HCT 16.141.1 09.636.0 - 04.546.0 %   MCV 84.4 78.0 - 100.0 fL   MCH 29.4 26.0 - 34.0 pg   MCHC 34.8 30.0 - 36.0 g/dL   RDW 40.912.1 81.111.5 - 91.415.5 %   Platelets 230 150 - 400 K/uL  CBC- normal, hemoconcentration   --/--/A POS (12/20 2224)  IMAGING [place] Ob Transvaginal  Result Date: 10/02/2017 CLINICAL DATA:  Status post spontaneous abortion. Severe pelvic pain vomiting. EXAM: TRANSVAGINAL OB ULTRASOUND TECHNIQUE: Transvaginal ultrasound was performed for complete evaluation of  the gestation as well as the maternal uterus, adnexal regions, and pelvic cul-de-sac. COMPARISON:  09/22/2017 FINDINGS: Intrauterine gestational sac: None Yolk sac:  N/A Embryo:  N/A Cardiac Activity: N/A Heart Rate: N/A bpm Maternal uterus/adnexae: The nonviable fetus seen on prior ultrasound is no longer identified. Mild thickening of the endometrium and 18 mm. There is some fluid and debris in the endometrial canal and in the endocervical canal consistent with ongoing abortion. No areas of vascularity to suggest retained products. The ovaries are unremarkable. No free pelvic fluid collections. IMPRESSION: 1. Status post missed abortion with 2 cycles of Cytotec. The intrauterine gestational sac and nonviable fetus, identified on the prior ultrasound, is no longer present. 2. Mild endometrial thickening and endometrial fluid and debris. 3. Normal ovaries. 4. No free pelvic fluid collections.  Electronically Signed   By: Rudie Meyer M.D.   On: 10/02/2017 15:56   US Ob Transvaginal  Result Date: 09/22/2017 CLINICAL DATA:  Vaginal bleeding, previous ultrasound demonstrating no fetal heart tone EXAM: OBSTETRIC <14 WK [place] AND TRANSVAGINAL OB US TECHNIQUE: Both transabdominal and transvaginal ultrasound examinations were performed for complete evaluation of the gestation as well as the maternal uterus, adnexal regions, and pelvic cul-de-sac. Transvaginal technique was performed to assess early pregnancy. COMPARISON:  None. FINDINGS: Intrauterine gestational sac: Visible Yolk sac:  Not visible Embryo:  Visible Cardiac Activity: Not visible CRL:  24  mm   9 w   0 d                  [place] EDC: 04/27/2018 Subchorionic hemorrhage:  None visualized. Maternal uterus/adnexae: Ovaries are within normal limits. Right ovary measures 2.7 x 2 x 1.6 cm. Left ovary measures 2.2 x 1.3 x 1.8 cm IMPRESSION: Single intrauterine pregnancy with embryo identified. Average crown-rump length is 24 mm and there is no fetal cardiac activity  identified. Findings meet definitive criteria for failed pregnancy. This follows SRU consensus guidelines: Diagnostic Criteria for Nonviable Pregnancy Early in the First Trimester. [facility] [phone]. Electronically Signed   By: Jasmine Pang M.D.   On: 09/22/2017 21:44    MAU Management/MDM: Orders Placed This Encounter  Procedures  . US OB Transvaginal  . CBC  . Discharge patient Discharge disposition: 01-Home or Self Care; Discharge patient date: 10/02/2017    Meds ordered this encounter  Medications  . nalbuphine (NUBAIN) injection 10 mg  . promethazine (PHENERGAN) injection 12.5 mg  . ondansetron (ZOFRAN-ODT) disintegrating tablet 4 mg  . ondansetron (ZOFRAN) 4 MG tablet    Sig: Take 1 tablet (4 mg total) by mouth every 6 (six) hours.    Dispense:  12 tablet    Refill:  0    Order Specific Question:   Supervising Provider    Answer:   Willodean Rosenthal [[id number]]   Treatments in MAU included 10mg  Nubain IM, 12.5mg  Phenergan IM, 4mg  Zofran PO.  Manual removal of POC from cervical os during speculum exam. Relief of pain after removal of products.   Pt discharged with strict bleeding precautions and to return to MAU as needed for increased bleeding and pain that is not relieved by medication.   ASSESSMENT 1. Complete miscarriage     PLAN Discharge home Rx for Zofran for nausea and vomiting  Follow up as scheduled on 10/13/17 and return to MAU as needed   Allergies as of 10/02/2017      Reactions   Augmentin [amoxicillin-pot Clavulanate] Shortness Of Breath, Rash   Amoxicillin-pot Clavulanate    Avelox [moxifloxacin Hcl In Nacl] Itching, Swelling      Medication List    STOP taking these medications   misoprostol 200 MCG tablet Commonly known as:  CYTOTEC   oxyCODONE-acetaminophen 5-325 MG tablet Commonly known as:  PERCOCET/ROXICET   prenatal multivitamin Tabs tablet   promethazine 25 MG tablet Commonly known as:  PHENERGAN     TAKE these  medications   ibuprofen 600 MG tablet Commonly known as:  ADVIL,MOTRIN Take 1 tablet (600 mg total) by mouth every 6 (six) hours as needed.   ondansetron 4 MG tablet Commonly known as:  ZOFRAN Take 1 tablet (4 mg total) by mouth every 6 (six) hours.   Vitamin D3 5000 units Caps Take 5,000 Units by mouth 2 (two) times daily.  Steward DroneVeronica Kenita Bines  Certified Nurse-Midwife 10/02/2017  2:24 PM

## 2017-10-13 ENCOUNTER — Encounter: Payer: Self-pay | Admitting: Student

## 2018-06-20 ENCOUNTER — Encounter (HOSPITAL_COMMUNITY): Payer: Self-pay

## 2018-09-22 LAB — OB RESULTS CONSOLE ABO/RH: RH Type: POSITIVE

## 2018-09-22 LAB — OB RESULTS CONSOLE GC/CHLAMYDIA
Chlamydia: NEGATIVE
Gonorrhea: NEGATIVE

## 2018-09-22 LAB — OB RESULTS CONSOLE RPR: RPR: NONREACTIVE

## 2018-09-22 LAB — OB RESULTS CONSOLE RUBELLA ANTIBODY, IGM: Rubella: IMMUNE

## 2018-09-22 LAB — OB RESULTS CONSOLE HIV ANTIBODY (ROUTINE TESTING): HIV: NONREACTIVE

## 2018-09-22 LAB — OB RESULTS CONSOLE ANTIBODY SCREEN: Antibody Screen: NEGATIVE

## 2018-09-22 LAB — OB RESULTS CONSOLE HEPATITIS B SURFACE ANTIGEN: Hepatitis B Surface Ag: NEGATIVE

## 2019-03-19 ENCOUNTER — Other Ambulatory Visit: Payer: Self-pay | Admitting: Obstetrics & Gynecology

## 2019-03-20 ENCOUNTER — Encounter (HOSPITAL_COMMUNITY): Payer: Self-pay | Admitting: *Deleted

## 2019-03-21 ENCOUNTER — Telehealth (HOSPITAL_COMMUNITY): Payer: Self-pay | Admitting: *Deleted

## 2019-03-21 NOTE — Telephone Encounter (Signed)
Preadmission screen  

## 2019-03-22 ENCOUNTER — Encounter (HOSPITAL_COMMUNITY): Payer: Self-pay

## 2019-03-22 NOTE — Patient Instructions (Signed)
[place]  03/22/2019   Your procedure is scheduled on:  04/03/2019  Arrive at 63 at Entrance C on [address] at [facility]  and [employer] [employer]. You are invited to use the FREE valet parking or use the Visitor's parking deck.  Pick up the phone at the desk and dial [phone].  Call this number if you have problems the morning of surgery: [phone]  Remember:   Do not eat food:(After Midnight) Desps de medianoche.  Do not drink clear liquids: (6 Hours before arrival) 6 horas ante llegada.  Take these medicines the morning of surgery with A SIP OF WATER:  Do not take metformin for 24 hours prior to surgery   Do not wear jewelry, make-up or nail polish.  Do not wear lotions, powders, or perfumes. Do not wear deodorant.  Do not shave 48 hours prior to surgery.  Do not bring valuables to the hospital.  [facility] is not   responsible for any belongings or valuables brought to the hospital.  Contacts, dentures or bridgework may not be worn into surgery.  Leave suitcase in the car. After surgery it may be brought to your room.  For patients admitted to the hospital, checkout time is 11:00 AM the day of              discharge.      Please read over the following fact sheets that you were given:     Preparing for Surgery

## 2019-03-26 ENCOUNTER — Inpatient Hospital Stay (HOSPITAL_COMMUNITY)
Admission: AD | Admit: 2019-03-26 | Discharge: 2019-03-29 | DRG: 787 | Disposition: A | Discharge status: Home / Self Care | Payer: BC Managed Care – PPO | Attending: Obstetrics & Gynecology | Admitting: Obstetrics & Gynecology

## 2019-03-26 ENCOUNTER — Inpatient Hospital Stay (HOSPITAL_COMMUNITY): Payer: BC Managed Care – PPO | Admitting: Anesthesiology

## 2019-03-26 ENCOUNTER — Other Ambulatory Visit: Payer: Self-pay

## 2019-03-26 ENCOUNTER — Encounter (HOSPITAL_COMMUNITY): Payer: Self-pay | Admitting: Anesthesiology

## 2019-03-26 ENCOUNTER — Encounter (HOSPITAL_COMMUNITY)
Admission: AD | Disposition: A | Discharge status: Home / Self Care | Payer: Self-pay | Source: Home / Self Care | Attending: Obstetrics & Gynecology

## 2019-03-26 DIAGNOSIS — O99214 Obesity complicating childbirth: Secondary | ICD-10-CM | POA: Diagnosis present

## 2019-03-26 DIAGNOSIS — O26893 Other specified pregnancy related conditions, third trimester: Secondary | ICD-10-CM

## 2019-03-26 DIAGNOSIS — O24425 Gestational diabetes mellitus in childbirth, controlled by oral hypoglycemic drugs: Secondary | ICD-10-CM | POA: Diagnosis present

## 2019-03-26 DIAGNOSIS — Z3A37 37 weeks gestation of pregnancy: Secondary | ICD-10-CM

## 2019-03-26 DIAGNOSIS — O321XX0 Maternal care for breech presentation, not applicable or unspecified: Principal | ICD-10-CM | POA: Diagnosis present

## 2019-03-26 DIAGNOSIS — Z1159 Encounter for screening for other viral diseases: Secondary | ICD-10-CM | POA: Diagnosis not present

## 2019-03-26 DIAGNOSIS — E669 Obesity, unspecified: Secondary | ICD-10-CM | POA: Diagnosis present

## 2019-03-26 DIAGNOSIS — O9081 Anemia of the puerperium: Secondary | ICD-10-CM | POA: Diagnosis not present

## 2019-03-26 DIAGNOSIS — O134 Gestational [pregnancy-induced] hypertension without significant proteinuria, complicating childbirth: Secondary | ICD-10-CM | POA: Diagnosis present

## 2019-03-26 DIAGNOSIS — R109 Unspecified abdominal pain: Secondary | ICD-10-CM | POA: Diagnosis not present

## 2019-03-26 DIAGNOSIS — M545 Low back pain: Secondary | ICD-10-CM

## 2019-03-26 DIAGNOSIS — D62 Acute posthemorrhagic anemia: Secondary | ICD-10-CM | POA: Diagnosis not present

## 2019-03-26 DIAGNOSIS — O139 Gestational [pregnancy-induced] hypertension without significant proteinuria, unspecified trimester: Secondary | ICD-10-CM | POA: Diagnosis present

## 2019-03-26 DIAGNOSIS — O99892 Other specified diseases and conditions complicating childbirth: Secondary | ICD-10-CM | POA: Diagnosis present

## 2019-03-26 HISTORY — DX: Maternal care for breech presentation, not applicable or unspecified: O32.1XX0

## 2019-03-26 LAB — COMPREHENSIVE METABOLIC PANEL
ALT: 27 U/L (ref 0–44)
AST: 23 U/L (ref 15–41)
Albumin: 3.3 g/dL — ABNORMAL LOW (ref 3.5–5.0)
Alkaline Phosphatase: 87 U/L (ref 38–126)
Anion gap: 12 (ref 5–15)
BUN: 16 mg/dL (ref 6–20)
CO2: 17 mmol/L — ABNORMAL LOW (ref 22–32)
Calcium: 9 mg/dL (ref 8.9–10.3)
Chloride: 104 mmol/L (ref 98–111)
Creatinine, Ser: 0.7 mg/dL (ref 0.44–1.00)
GFR calc Af Amer: >60 mL/min (ref >60)
GFR calc non Af Amer: >60 mL/min (ref >60)
Glucose, Bld: 85 mg/dL (ref 70–99)
Potassium: 3.8 mmol/L (ref 3.5–5.1)
Sodium: 133 mmol/L — ABNORMAL LOW (ref 135–145)
Total Bilirubin: 0.8 mg/dL (ref 0.3–1.2)
Total Protein: 6.3 g/dL — ABNORMAL LOW (ref 6.5–8.1)

## 2019-03-26 LAB — CBC
HCT: 38 % (ref 36.0–46.0)
Hemoglobin: 13.1 g/dL (ref 12.0–15.0)
MCH: 30 pg (ref 26.0–34.0)
MCHC: 34.5 g/dL (ref 30.0–36.0)
MCV: 87 fL (ref 80.0–100.0)
Platelets: 250 10*3/uL (ref 150–400)
RBC: 4.37 MIL/uL (ref 3.87–5.11)
RDW: 12.4 % (ref 11.5–15.5)
WBC: 13.7 10*3/uL — ABNORMAL HIGH (ref 4.0–10.5)
nRBC: 0 % (ref 0.0–0.2)

## 2019-03-26 LAB — PROTEIN / CREATININE RATIO, URINE
Creatinine, Urine: 134.18 mg/dL
Protein Creatinine Ratio: 0.1 mg/mg{Cre} (ref 0.00–0.15)
Total Protein, Urine: 14 mg/dL

## 2019-03-26 LAB — GLUCOSE, CAPILLARY
Glucose-Capillary: 72 mg/dL (ref 70–99)
Glucose-Capillary: 139 mg/dL — ABNORMAL HIGH (ref 70–99)

## 2019-03-26 LAB — SARS CORONAVIRUS 2 BY RT PCR (HOSPITAL ORDER, PERFORMED IN ~~LOC~~ HOSPITAL LAB): SARS Coronavirus 2: NEGATIVE

## 2019-03-26 SURGERY — Surgical Case
Anesthesia: Spinal | Wound class: Clean Contaminated

## 2019-03-26 MED ORDER — ONDANSETRON HCL 4 MG/2ML IJ SOLN
INTRAMUSCULAR | Status: DC | PRN
  Administered 2019-03-26: 4 mg via INTRAVENOUS

## 2019-03-26 MED ORDER — ONDANSETRON HCL 4 MG/2ML IJ SOLN: 4.0000 mg | Freq: Three times a day (TID) | INTRAMUSCULAR | Status: DC | PRN

## 2019-03-26 MED ORDER — GENTAMICIN SULFATE 40 MG/ML IJ SOLN
5.0000 mg/kg | INTRAVENOUS | Status: AC
  Administered 2019-03-26: 511.2 mg via INTRAVENOUS
  Filled 2019-03-26: qty 12.75

## 2019-03-26 MED ORDER — FENTANYL CITRATE (PF) 100 MCG/2ML IJ SOLN: 25.0000 ug | INTRAMUSCULAR | Status: DC | PRN

## 2019-03-26 MED ORDER — PHENYLEPHRINE HCL-NACL 20-0.9 MG/250ML-% IV SOLN
INTRAVENOUS | Status: AC
  Filled 2019-03-26: qty 250

## 2019-03-26 MED ORDER — SCOPOLAMINE 1 MG/3DAYS TD PT72
MEDICATED_PATCH | TRANSDERMAL | Status: AC
  Filled 2019-03-26: qty 1

## 2019-03-26 MED ORDER — LACTATED RINGERS IV SOLN
INTRAVENOUS | Status: DC | PRN
  Administered 2019-03-26: 21:00:00 via INTRAVENOUS

## 2019-03-26 MED ORDER — PHENYLEPHRINE HCL-NACL 20-0.9 MG/250ML-% IV SOLN
INTRAVENOUS | Status: DC | PRN
  Administered 2019-03-26: 60 ug/min via INTRAVENOUS

## 2019-03-26 MED ORDER — CLINDAMYCIN PHOSPHATE 900 MG/50ML IV SOLN
900.0000 mg | INTRAVENOUS | Status: AC
  Administered 2019-03-26: 900 mg via INTRAVENOUS
  Filled 2019-03-26: qty 50

## 2019-03-26 MED ORDER — SOD CITRATE-CITRIC ACID 500-334 MG/5ML PO SOLN: 30.0000 mL | ORAL | Status: DC

## 2019-03-26 MED ORDER — KETOROLAC TROMETHAMINE 30 MG/ML IJ SOLN: 30.0000 mg | Freq: Four times a day (QID) | INTRAMUSCULAR | Status: AC | PRN

## 2019-03-26 MED ORDER — MORPHINE SULFATE (PF) 0.5 MG/ML IJ SOLN
INTRAMUSCULAR | Status: DC | PRN
  Administered 2019-03-26: .15 mg via INTRATHECAL

## 2019-03-26 MED ORDER — SODIUM CHLORIDE 0.9 % IV SOLN
INTRAVENOUS | Status: DC | PRN
  Administered 2019-03-26: 40 [IU] via INTRAVENOUS

## 2019-03-26 MED ORDER — NALOXONE HCL 0.4 MG/ML IJ SOLN: 0.4000 mg | INTRAMUSCULAR | Status: DC | PRN

## 2019-03-26 MED ORDER — ONDANSETRON HCL 4 MG/2ML IJ SOLN
INTRAMUSCULAR | Status: AC
  Filled 2019-03-26: qty 2

## 2019-03-26 MED ORDER — SODIUM CHLORIDE 0.9% FLUSH: 3.0000 mL | INTRAVENOUS | Status: DC | PRN

## 2019-03-26 MED ORDER — MORPHINE SULFATE (PF) 0.5 MG/ML IJ SOLN
INTRAMUSCULAR | Status: AC
  Filled 2019-03-26: qty 10

## 2019-03-26 MED ORDER — SODIUM CHLORIDE 0.9 % IV SOLN
INTRAVENOUS | Status: DC | PRN
  Administered 2019-03-26: 22:00:00 via INTRAVENOUS

## 2019-03-26 MED ORDER — SCOPOLAMINE 1 MG/3DAYS TD PT72
1.0000 | MEDICATED_PATCH | Freq: Once | TRANSDERMAL | Status: DC
  Administered 2019-03-26: 1.5 mg via TRANSDERMAL
  Filled 2019-03-26: qty 1

## 2019-03-26 MED ORDER — KETOROLAC TROMETHAMINE 30 MG/ML IJ SOLN
INTRAMUSCULAR | Status: AC
  Filled 2019-03-26: qty 1

## 2019-03-26 MED ORDER — FENTANYL CITRATE (PF) 100 MCG/2ML IJ SOLN
INTRAMUSCULAR | Status: AC
  Filled 2019-03-26: qty 2

## 2019-03-26 MED ORDER — SOD CITRATE-CITRIC ACID 500-334 MG/5ML PO SOLN
30.0000 mL | Freq: Once | ORAL | Status: AC
  Administered 2019-03-26: 21:00:00 30 mL via ORAL
  Filled 2019-03-26: qty 30

## 2019-03-26 MED ORDER — BUPIVACAINE IN DEXTROSE 0.75-8.25 % IT SOLN
INTRATHECAL | Status: DC | PRN
  Administered 2019-03-26: 1.8 mg via INTRATHECAL

## 2019-03-26 MED ORDER — OXYTOCIN 40 UNITS IN NORMAL SALINE INFUSION - SIMPLE MED
INTRAVENOUS | Status: AC
  Filled 2019-03-26: qty 1000

## 2019-03-26 MED ORDER — DEXTROSE 5 % IN LACTATED RINGERS IV BOLUS
250.0000 mL | Freq: Once | INTRAVENOUS | Status: AC
  Administered 2019-03-26: 250 mL via INTRAVENOUS

## 2019-03-26 MED ORDER — KETOROLAC TROMETHAMINE 30 MG/ML IJ SOLN
30.0000 mg | Freq: Four times a day (QID) | INTRAMUSCULAR | Status: AC | PRN
  Administered 2019-03-27: 30 mg via INTRAMUSCULAR

## 2019-03-26 MED ORDER — MEPERIDINE HCL 25 MG/ML IJ SOLN: 6.2500 mg | INTRAMUSCULAR | Status: DC | PRN

## 2019-03-26 MED ORDER — FENTANYL CITRATE (PF) 100 MCG/2ML IJ SOLN
INTRAMUSCULAR | Status: DC | PRN
  Administered 2019-03-26: 15 ug via INTRATHECAL

## 2019-03-26 SURGICAL SUPPLY — 41 items
APL SKNCLS STERI-STRIP NONHPOA (GAUZE/BANDAGES/DRESSINGS) ×1
BENZOIN TINCTURE PRP APPL 2/3 (GAUZE/BANDAGES/DRESSINGS) ×1 IMPLANT
CHLORAPREP W/TINT 26ML (MISCELLANEOUS) ×2 IMPLANT
CLAMP CORD UMBIL (MISCELLANEOUS) IMPLANT
CLOTH BEACON ORANGE TIMEOUT ST (SAFETY) ×2 IMPLANT
CLSR STERI-STRIP ANTIMIC 1/2X4 (GAUZE/BANDAGES/DRESSINGS) ×1 IMPLANT
DRSG OPSITE POSTOP 4X10 (GAUZE/BANDAGES/DRESSINGS) ×2 IMPLANT
ELECT REM PT RETURN 9FT ADLT (ELECTROSURGICAL) ×2
ELECTRODE REM PT RTRN 9FT ADLT (ELECTROSURGICAL) ×1 IMPLANT
EXTRACTOR VACUUM KIWI (MISCELLANEOUS) IMPLANT
EXTRACTOR VACUUM M CUP 4 TUBE (SUCTIONS) IMPLANT
GAUZE SPONGE 4X4 12PLY STRL LF (GAUZE/BANDAGES/DRESSINGS) ×2 IMPLANT
GLOVE BIO SURGEON STRL SZ7 (GLOVE) ×2 IMPLANT
GLOVE BIOGEL PI IND STRL 7.0 (GLOVE) ×2 IMPLANT
GLOVE BIOGEL PI INDICATOR 7.0 (GLOVE) ×2
GOWN STRL REUS W/TWL LRG LVL3 (GOWN DISPOSABLE) ×4 IMPLANT
KIT ABG SYR 3ML LUER SLIP (SYRINGE) IMPLANT
NDL HYPO 25X5/8 SAFETYGLIDE (NEEDLE) IMPLANT
NEEDLE HYPO 25X5/8 SAFETYGLIDE (NEEDLE) IMPLANT
NS IRRIG 1000ML POUR BTL (IV SOLUTION) ×2 IMPLANT
PACK C SECTION WH (CUSTOM PROCEDURE TRAY) ×2 IMPLANT
PAD ABD 7.5X8 STRL (GAUZE/BANDAGES/DRESSINGS) ×1 IMPLANT
PAD ABD DERMACEA PRESS 5X9 (GAUZE/BANDAGES/DRESSINGS) ×1 IMPLANT
PAD OB MATERNITY 4.3X12.25 (PERSONAL CARE ITEMS) ×2 IMPLANT
RTRCTR C-SECT PINK 25CM LRG (MISCELLANEOUS) IMPLANT
STRIP CLOSURE SKIN 1/2X4 (GAUZE/BANDAGES/DRESSINGS) IMPLANT
SUT MNCRL 0 VIOLET CTX 36 (SUTURE) ×2 IMPLANT
SUT MONOCRYL 0 CTX 36 (SUTURE) ×2
SUT PLAIN 0 NONE (SUTURE) IMPLANT
SUT PLAIN 2 0 (SUTURE)
SUT PLAIN 2 0 XLH (SUTURE) ×1 IMPLANT
SUT PLAIN ABS 2-0 CT1 27XMFL (SUTURE) IMPLANT
SUT VIC AB 0 CT1 27 (SUTURE) ×4
SUT VIC AB 0 CT1 27XBRD ANBCTR (SUTURE) ×2 IMPLANT
SUT VIC AB 2-0 CT1 27 (SUTURE) ×2
SUT VIC AB 2-0 CT1 TAPERPNT 27 (SUTURE) ×1 IMPLANT
SUT VIC AB 4-0 KS 27 (SUTURE) ×2 IMPLANT
SUT VICRYL 0 TIES 12 18 (SUTURE) IMPLANT
TOWEL OR 17X24 6PK STRL BLUE (TOWEL DISPOSABLE) ×2 IMPLANT
TRAY FOLEY W/BAG SLVR 14FR LF (SET/KITS/TRAYS/PACK) IMPLANT
WATER STERILE IRR 1000ML POUR (IV SOLUTION) ×2 IMPLANT

## 2019-03-26 NOTE — MAU Provider Note (Signed)
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 G3P0020 at [redacted]w[redacted]d here with complaints of back pain, lower abdominal pain and leaking of fluid. She denies vaginal bleeding, decreased fetal movements, HA, fever, SOB, body aches. She is a GDMA2 on metformin 750 BID.  History     CSN: [id number]  Arrival date and time: 03/26/19 1637   First Provider Initiated Contact with Patient 03/26/19 1743      Chief Complaint  Patient presents with  . Back Pain  . Contractions  . Hypertension   HPI  OB History    Gravida  3   Para      Term      Preterm      AB  2   Living        SAB  1   TAB      Ectopic  1   Multiple      Live Births              Past Medical History:  Diagnosis Date  . Complication of anesthesia    locals dont numb very well  . Diabetes mellitus without complication ([place])   . Dysmenorrhea   . Ectopic pregnancy    treated with medication  . GERD (gastroesophageal reflux disease)   . Gestational diabetes   . Headache   . Herniated disc, cervical   . History of diabetes mellitus   . History of genital warts   . HPV in female     Past Surgical History:  Procedure Laterality Date  . CRANIOTOMY    . SHOULDER SURGERY    . skull surgery    . TONSILLECTOMY      Family History  Problem Relation Age of Onset  . Diabetes Maternal Grandmother   . Diabetes Maternal Grandfather     Social History   Tobacco Use  . Smoking status: Never Smoker  . Smokeless tobacco: Never Used  Substance Use Topics  . Alcohol use: Yes    Alcohol/week: 2.0 - 3.0 standard drinks    Types: 2 - 3 Standard drinks or equivalent per week  . Drug use: No    Allergies:  Allergies  Allergen Reactions  . Augmentin [Amoxicillin-Pot Clavulanate] Shortness Of Breath and Rash  . Avelox [Moxifloxacin Hcl In Nacl] Itching and Swelling    Medications Prior to Admission  Medication Sig Dispense Refill Last Dose  . Cholecalciferol (VITAMIN D3) 5000 units CAPS Take 5,000 Units  by mouth 2 (two) times daily.     Marland Kitchen ibuprofen (ADVIL,MOTRIN) 600 MG tablet Take 1 tablet (600 mg total) by mouth every 6 (six) hours as needed. (Patient not taking: Reported on 10/02/2017) 30 tablet 0   . ondansetron (ZOFRAN) 4 MG tablet Take 1 tablet (4 mg total) by mouth every 6 (six) hours. 12 tablet 0     Review of Systems  Constitutional: Negative.   HENT: Negative.   Respiratory: Negative.   Cardiovascular: Negative.   Gastrointestinal: Negative.   Genitourinary: Negative.   Musculoskeletal: Negative.   Neurological: Negative.    Physical Exam   Blood pressure (!) 149/98, pulse 86, temperature 98.4 F (36.9 C), temperature source Oral, resp. rate 20, height 5\' 7"  (1.702 m), weight 102.2 kg, SpO2 100 %, unknown if currently breastfeeding.  Physical Exam  Constitutional: She appears well-developed.  HENT:  Head: Normocephalic.  Neck: Normal range of motion.  GI: Soft.  Genitourinary:    Genitourinary Comments: NEFG; cervix is FT, posterior, long.    Musculoskeletal:  Normal range of motion.  Neurological: She is alert.  Skin: Skin is warm.  Psychiatric: She has a normal mood and affect.    MAU Course  Procedures  MDM -BS was 72  -NST -CBC, CMP and PCR drawn -Patient's BS is 72 and she reports she feels like it is dropping, will give 250 bolus of D5LR and recheck after 1 hour.  -CBC normal, CMP normal, PCR normal.   Assessment and Plan   BPs are bordering on severe; Dr. Juliene PinaMody at the bedside to evaluate patient; she assumes care of patient. Patient to OR.   Kaitlyn Costa 03/26/2019, 5:57 PM

## 2019-03-26 NOTE — Anesthesia Procedure Notes (Signed)
Spinal  Patient location during procedure: OR Start time: 03/26/2019 9:13 PM End time: 03/26/2019 9:18 PM Staffing Anesthesiologist: Josephine Igo, MD Performed: anesthesiologist  Preanesthetic Checklist Completed: patient identified, site marked, surgical consent, pre-op evaluation, timeout performed, IV checked, risks and benefits discussed and monitors and equipment checked Spinal Block Patient position: sitting Prep: site prepped and draped and DuraPrep Patient monitoring: heart rate, cardiac monitor, continuous pulse ox and blood pressure Approach: midline Location: L3-4 Injection technique: single-shot Needle Needle type: Pencan  Needle gauge: 24 G Needle length: 9 cm Needle insertion depth: 7 cm Assessment Sensory level: T4 Additional Notes Patient tolerated procedure well. Adequate sensory level.

## 2019-03-26 NOTE — H&P (Signed)
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 female G3P0020, 37.6 wks, with GDMA2 and breech who was scheduled for C/s at 39 wks, but was sent from office for elevated BP of 152/104 in office. Pt reports not feeling well with UCs getting painful and back pain. She was ruled out for SROM in office. No vb. +FMs.   Spontaneous pregnancy, dating by 8 week sono. Good PNCare.  Excessive weight gain in 1st trim prompted early GDM screen at 15 wks-> A1C 6.4 and Glucola 217 c/w GDM.  Lantus insulin and Metformin 750mg  XL started at 18 wks, Lantus gradually increased to 16 -18 units by 35 wks, adding Novolin attempted at 34 wks but had local allergic response to stopped. She stopped Lantus around 36 wks due to low CBG and having hypoglycemic feeling, but Metformin was continued.  Serial growth sono q 4 wks from 26 wks, baby remained AGA, AFI was up at 26 wks at 35 wks but decreased to normal range at f/up [facility] NSTs from 32 wks and BPP added from 34 wks - normal 10/10 score.   Other issues in pregnancy- incl mood changes with anger, poor sleep quality, vertigo, dizziness, fatigue, back pain.    OB History    Gravida  3   Para      Term      Preterm      AB  2   Living        SAB  1   TAB      Ectopic  1   Multiple      Live Births             Past Medical History:  Diagnosis Date  . Complication of anesthesia    locals dont numb very well  . Diabetes mellitus without complication ([place])   . Dysmenorrhea   . Ectopic pregnancy    treated with medication  . GERD (gastroesophageal reflux disease)   . Gestational diabetes   . Headache   . Herniated disc, cervical   . History of diabetes mellitus   . History of genital warts   . HPV in female    Past Surgical History:  Procedure Laterality Date  . CRANIOTOMY    . SHOULDER SURGERY    . skull surgery    . TONSILLECTOMY     Family History: family history includes Diabetes in her maternal grandfather and maternal grandmother. Social  History:  reports that she has never smoked. She has never used smokeless tobacco. She reports current alcohol use of about 2.0 - 3.0 standard drinks of alcohol per week. She reports that she does not use drugs.     Maternal Diabetes: Yes:  Diabetes Type:  Insulin/Medication controlled - Lantus from 18 wks and increased up to 18 units, stopped at 36 wks due to low CBG. Metformin 750mg  XL at night was continued  Genetic Screening: Normal QUAD Maternal Ultrasounds/Referrals: Normal Fetal Ultrasounds or other Referrals:  None Maternal Substance Abuse:  No Significant Maternal Medications:  Meds include: Other: Metformin 750mg  XL, Lantus insulin (in preg, but stopped at 36 wks),  Omeprazole  Significant Maternal Lab Results:  Group B Strep negative Other Comments:  Gestational HTN today, so delivery moved up from 39 wks, C/s for persistent Breech, no preeclampsia   ROS no HA/RUQ pain/ vision changes. But reports not feeling well due to UCs, back pain.  History Dilation: 1 Effacement (%): Thick Exam by:: [email or web address]   Patient Vitals for the past 24 hrs:  BP Temp Temp src Pulse Resp SpO2 Height Weight  03/26/19 1845 (!) 147/89 - - 91 - - - -  03/26/19 1830 (!) 138/94 - - 85 - - - -  03/26/19 1815 (!) 150/95 - - 93 - - - -  03/26/19 1800 (!) 137/97 - - 90 - - - -  03/26/19 1745 (!) 149/98 - - 86 - - - -  03/26/19 1730 (!) 139/97 - - 90 - - - -  03/26/19 1722 - - - - - - 5\' 7"  (1.702 m) 102.2 kg  03/26/19 1716 (!) 135/94 - - 95 - - - -  03/26/19 1659 (!) 149/102 98.4 F (36.9 C) Oral 97 20 100 % 5\' 7"  (1.702 m) 102.2 kg   Exam Physical Exam  BP (!) 147/89   Pulse 91   Temp 98.4 F (36.9 C) (Oral)   Resp 20   Ht 5\' 7"  (1.702 m)   Wt 102.2 kg   SpO2 100%   BMI 35.29 kg/m   A&O x 3, no acute distress. Pleasant HEENT neg, no thyromegaly Lungs CTA bilat CV RRR, S1S2 normal Abdo soft, non tender, non acute Gravid uterus, Breech. S>>D, body habitus Extr no edema/ tenderness. LE  swelling +1. DTR+2  Pelvic Cx f.tip, thick, high station  FHT  150s + accels no decels mod variab- cat I Toco none but pt reports having them regularly   Prenatal labs: ABO, Rh: A/Positive/-- (12/20 0000) Antibody: Negative (12/20 0000) Rubella: Immune (12/20 0000) RPR: Nonreactive (12/20 0000)  HBsAg: Negative (12/20 0000)  HIV: Non-reactive (12/20 0000)  GBS:  Negative  QUAD: normal, negative Glucola at 15 wks - 217   Assessment/Plan: 33 yo G1, 37.6 wks with A2GDM (Insulin and Metformin) and Breech, with elevated BPs c/w Gestational HTN.  Normal CBC/CMP/ Urine P/c ratio, no neural symptoms  Persistent breech, delivery by C-section now. Covid test sent, T&S sent, proceed to OR once back  Risks/complications of surgery reviewed incl infection, bleeding, damage to internal organs including bladder, bowels, ureters, blood vessels, other risks from anesthesia, VTE and delayed complications of any surgery, complications in future surgery reviewed. Also discussed neonatal complications incl difficult delivery, laceration, vacuum assistance, TTN etc. Pt understands and agrees, all concerns addressed.    Robley FriesVaishali R Kayde Atkerson 03/26/2019, 7:29 PM

## 2019-03-26 NOTE — Op Note (Signed)
Cesarean Section Procedure Note   [name] Nurse 03/26/2019  Procedure: Primary Low Transverse Cesarean section.   Indications: Breech Presentation. Gestational Hypertension, 37.6 weeks, A2 Gestational diabetes  Pre-operative Diagnosis: Breech Presentation, Gestational Hypertension and A2. Gestational diabetes  Post-operative Diagnosis: Same   Surgeon: Azucena Fallen, MD  Assistants: Artelia Laroche, CNM  Anesthesia: Spinal    Procedure Details:  The patient was seen in the Holding Room. The risks, benefits, complications, treatment options, and expected outcomes were discussed with the patient. The patient concurred with the proposed plan, giving informed consent. identified as Kaitlyn Costa and the procedure verified as C-Section Delivery. A Time Out was held and the above information confirmed.  After induction of anesthesia, the patient was draped and prepped in the usual sterile manner, foley was draining urine well.  A pfannenstiel incision was made and carried down through the subcutaneous tissue to the fascia. Fascial incision was made and extended transversely. The fascia was separated from the underlying rectus tissue superiorly and inferiorly. The peritoneum was identified and entered. Peritoneal incision was extended longitudinally. Alexis-O retractor placed. The utero-vesical peritoneal reflection was incised transversely and the bladder flap was bluntly freed from the lower uterine segment. A low transverse uterine incision was made. Delivered from frank breech presentation by complete breech extraction was a female infant. Cry was poor, so cord clamped and cut at 30 seconds and baby was handed off to NICU team in attendance. Apgar scores of 7 at one minute and 8 at five minutes. Cord ph was sent and was 7.08  Cord blood was obtained for evaluation. The placenta was removed Intact and appeared normal. The uterine outline, tubes and ovaries appeared normal}. The uterine incision  was closed with running locked sutures of 0Monocryl. A second imbricating layer sutured.   Hemostasis was observed. Alexis retractor removed. Peritoneal closure done with 2-0 Vicryl.  The fascia was then reapproximated with running sutures of 0Vicryl. The subcuticular closure was performed using 2-0plain gut. The skin was closed with 4-0Vicryl.   Instrument, sponge, and needle counts were correct prior the abdominal closure and were correct at the conclusion of the case.    Findings: female infant delivered from [facility] hysterotomy from frank breech presentation using complete breech extraction steps. Baby was handed off to NICU team in attendance. Cord pH (arterial) 7.08.  Normal ovaries, tubes and uterus.    Estimated Blood Loss: 430 ml  Total IV Fluids: LR 1000 ml   Urine Output: 100CC OF clear urine  Specimens: cord pH. Cord blood   Complications: no complications  Disposition: PACU - hemodynamically stable.   Maternal Condition: stable   Baby condition / location:  Couplet care / Skin to Skin  Attending Attestation: I performed the procedure.   Signed: Surgeon(s): Azucena Fallen, MD

## 2019-03-26 NOTE — Anesthesia Preprocedure Evaluation (Signed)
Anesthesia Evaluation  Patient identified by MRN, date of birth, ID band Patient awake    Reviewed: Allergy & Precautions, NPO status , Patient's Chart, lab work & pertinent test results  History of Anesthesia Complications (+) history of anesthetic complications  Airway Mallampati: II  TM Distance: >3 FB Neck ROM: Full    Dental no notable dental hx. (+) Teeth Intact   Pulmonary neg pulmonary ROS,    Pulmonary exam normal breath sounds clear to auscultation       Cardiovascular negative cardio ROS Normal cardiovascular exam Rhythm:Regular Rate:Normal     Neuro/Psych  Headaches, Anxiety    GI/Hepatic Neg liver ROS, GERD  Controlled and Medicated,  Endo/Other  diabetes, Well Controlled, GestationalHypothyroidism Diet controlled Obesity  Renal/GU negative Renal ROS  negative genitourinary   Musculoskeletal negative musculoskeletal ROS (+)   Abdominal (+) + obese,   Peds  Hematology negative hematology ROS (+)   Anesthesia Other Findings   Reproductive/Obstetrics (+) Pregnancy 37 3/7 weeks Breech presentation                             Anesthesia Physical Anesthesia Plan  ASA: II and emergent  Anesthesia Plan: Spinal   Post-op Pain Management:    Induction:   PONV Risk Score and Plan: 4 or greater and Scopolamine patch - Pre-op, Ondansetron and Treatment may vary due to age or medical condition  Airway Management Planned: Natural Airway  Additional Equipment:   Intra-op Plan:   Post-operative Plan:   Informed Consent: I have reviewed the patients History and Physical, chart, labs and discussed the procedure including the risks, benefits and alternatives for the proposed anesthesia with the patient or authorized representative who has indicated his/her understanding and acceptance.       Plan Discussed with: CRNA and Surgeon  Anesthesia Plan Comments:          Anesthesia Quick Evaluation

## 2019-03-26 NOTE — Transfer of Care (Signed)
Immediate Anesthesia Transfer of Care Note  Patient: Kaitlyn Costa  Procedure(s) Performed: Primary CESAREAN SECTION (N/A )  Patient Location: PACU  Anesthesia Type:Spinal  Level of Consciousness: awake  Airway & Oxygen Therapy: Patient Spontanous Breathing  Post-op Assessment: Report given to RN  Post vital signs: Reviewed and stable  Last Vitals:  Vitals Value Taken Time  BP    Temp    Pulse 60 03/26/19 2226  Resp 30 03/26/19 2226  SpO2 98 % 03/26/19 2226  Vitals shown include unvalidated device data.  Last Pain:  Vitals:   03/26/19 1723  TempSrc:   PainSc: 4          Complications: No apparent anesthesia complications

## 2019-03-26 NOTE — MAU Note (Signed)
Seen in office for r/o rom, not ruptured. Is having contractions, back is hurting.  BP was elevated.  Sent in for further eval.  Baby is frank breech.  Denies HA, visual changes, epigastric pain, no change in swelling.

## 2019-03-27 ENCOUNTER — Encounter (HOSPITAL_COMMUNITY): Payer: Self-pay

## 2019-03-27 DIAGNOSIS — O99892 Other specified diseases and conditions complicating childbirth: Secondary | ICD-10-CM | POA: Diagnosis present

## 2019-03-27 DIAGNOSIS — O321XX0 Maternal care for breech presentation, not applicable or unspecified: Secondary | ICD-10-CM | POA: Diagnosis present

## 2019-03-27 LAB — CBC
HCT: 30 % — ABNORMAL LOW (ref 36.0–46.0)
Hemoglobin: 10 g/dL — ABNORMAL LOW (ref 12.0–15.0)
MCH: 29.4 pg (ref 26.0–34.0)
MCHC: 33.3 g/dL (ref 30.0–36.0)
MCV: 88.2 fL (ref 80.0–100.0)
Platelets: 173 10*3/uL (ref 150–400)
RBC: 3.4 MIL/uL — ABNORMAL LOW (ref 3.87–5.11)
RDW: 12.7 % (ref 11.5–15.5)
WBC: 10.7 10*3/uL — ABNORMAL HIGH (ref 4.0–10.5)
nRBC: 0 % (ref 0.0–0.2)

## 2019-03-27 LAB — ABO/RH: ABO/RH(D): A POS

## 2019-03-27 LAB — GLUCOSE, CAPILLARY
Glucose-Capillary: 103 mg/dL — ABNORMAL HIGH (ref 70–99)
Glucose-Capillary: 142 mg/dL — ABNORMAL HIGH (ref 70–99)
Glucose-Capillary: 142 mg/dL — ABNORMAL HIGH (ref 70–99)

## 2019-03-27 LAB — TYPE AND SCREEN
ABO/RH(D): A POS
Antibody Screen: NEGATIVE

## 2019-03-27 MED ORDER — MAGNESIUM OXIDE 400 (241.3 MG) MG PO TABS
400.0000 mg | ORAL_TABLET | Freq: Every day | ORAL | Status: DC
  Administered 2019-03-28 – 2019-03-29 (×2): 400 mg via ORAL
  Filled 2019-03-27 (×2): qty 1

## 2019-03-27 MED ORDER — OXYTOCIN 40 UNITS IN NORMAL SALINE INFUSION - SIMPLE MED: 2.5000 [IU]/h | INTRAVENOUS | Status: AC

## 2019-03-27 MED ORDER — NALBUPHINE HCL 10 MG/ML IJ SOLN: 5.0000 mg | INTRAMUSCULAR | Status: DC | PRN

## 2019-03-27 MED ORDER — SENNOSIDES-DOCUSATE SODIUM 8.6-50 MG PO TABS
2.0000 | ORAL_TABLET | ORAL | Status: DC
  Administered 2019-03-27 – 2019-03-29 (×2): 2 via ORAL
  Filled 2019-03-27 (×2): qty 2

## 2019-03-27 MED ORDER — MENTHOL 3 MG MT LOZG: 1.0000 | LOZENGE | OROMUCOSAL | Status: DC | PRN

## 2019-03-27 MED ORDER — SIMETHICONE 80 MG PO CHEW: 80.0000 mg | CHEWABLE_TABLET | ORAL | Status: DC | PRN

## 2019-03-27 MED ORDER — DIPHENHYDRAMINE HCL 25 MG PO CAPS: 25.0000 mg | ORAL_CAPSULE | ORAL | Status: DC | PRN

## 2019-03-27 MED ORDER — NALOXONE HCL 4 MG/10ML IJ SOLN
1.0000 ug/kg/h | INTRAVENOUS | Status: DC | PRN
  Filled 2019-03-27: qty 5

## 2019-03-27 MED ORDER — SIMETHICONE 80 MG PO CHEW
80.0000 mg | CHEWABLE_TABLET | Freq: Three times a day (TID) | ORAL | Status: DC
  Administered 2019-03-27 – 2019-03-29 (×5): 80 mg via ORAL
  Filled 2019-03-27 (×5): qty 1

## 2019-03-27 MED ORDER — SIMETHICONE 80 MG PO CHEW
80.0000 mg | CHEWABLE_TABLET | ORAL | Status: DC
  Administered 2019-03-27 – 2019-03-29 (×2): 80 mg via ORAL
  Filled 2019-03-27 (×2): qty 1

## 2019-03-27 MED ORDER — DIPHENHYDRAMINE HCL 50 MG/ML IJ SOLN: 12.5000 mg | INTRAMUSCULAR | Status: DC | PRN

## 2019-03-27 MED ORDER — DIBUCAINE (PERIANAL) 1 % EX OINT: 1.0000 "application " | TOPICAL_OINTMENT | CUTANEOUS | Status: DC | PRN

## 2019-03-27 MED ORDER — METFORMIN HCL ER 500 MG PO TB24
500.0000 mg | ORAL_TABLET | Freq: Every day | ORAL | Status: DC
  Filled 2019-03-27: qty 1

## 2019-03-27 MED ORDER — COCONUT OIL OIL: 1.0000 "application " | TOPICAL_OIL | Status: DC | PRN

## 2019-03-27 MED ORDER — TETANUS-DIPHTH-ACELL PERTUSSIS 5-2.5-18.5 LF-MCG/0.5 IM SUSP: 0.5000 mL | Freq: Once | INTRAMUSCULAR | Status: DC

## 2019-03-27 MED ORDER — IBUPROFEN 800 MG PO TABS
800.0000 mg | ORAL_TABLET | Freq: Three times a day (TID) | ORAL | Status: DC
  Administered 2019-03-27 – 2019-03-29 (×7): 800 mg via ORAL
  Filled 2019-03-27 (×7): qty 1

## 2019-03-27 MED ORDER — PRENATAL MULTIVITAMIN CH
1.0000 | ORAL_TABLET | Freq: Every day | ORAL | Status: DC
  Administered 2019-03-27 – 2019-03-28 (×2): 1 via ORAL
  Filled 2019-03-27 (×2): qty 1

## 2019-03-27 MED ORDER — DIPHENHYDRAMINE HCL 25 MG PO CAPS: 25.0000 mg | ORAL_CAPSULE | Freq: Four times a day (QID) | ORAL | Status: DC | PRN

## 2019-03-27 MED ORDER — OXYCODONE-ACETAMINOPHEN 5-325 MG PO TABS: 1.0000 | ORAL_TABLET | ORAL | Status: DC | PRN

## 2019-03-27 MED ORDER — NALBUPHINE HCL 10 MG/ML IJ SOLN: 5.0000 mg | Freq: Once | INTRAMUSCULAR | Status: DC | PRN

## 2019-03-27 MED ORDER — POLYSACCHARIDE IRON COMPLEX 150 MG PO CAPS
150.0000 mg | ORAL_CAPSULE | Freq: Every day | ORAL | Status: DC
  Administered 2019-03-28 – 2019-03-29 (×2): 150 mg via ORAL
  Filled 2019-03-27 (×2): qty 1

## 2019-03-27 MED ORDER — NALBUPHINE HCL 10 MG/ML IJ SOLN
5.0000 mg | INTRAMUSCULAR | Status: DC | PRN
  Administered 2019-03-27: 06:00:00 5 mg via SUBCUTANEOUS
  Filled 2019-03-27: qty 1

## 2019-03-27 MED ORDER — WITCH HAZEL-GLYCERIN EX PADS: 1.0000 "application " | MEDICATED_PAD | CUTANEOUS | Status: DC | PRN

## 2019-03-27 MED ORDER — METFORMIN HCL 500 MG PO TABS
500.0000 mg | ORAL_TABLET | Freq: Every day | ORAL | Status: DC
  Administered 2019-03-27 – 2019-03-29 (×3): 500 mg via ORAL
  Filled 2019-03-27 (×3): qty 1

## 2019-03-27 MED ORDER — LACTATED RINGERS IV SOLN
INTRAVENOUS | Status: DC
  Administered 2019-03-27: 04:00:00 via INTRAVENOUS

## 2019-03-27 MED ORDER — ACETAMINOPHEN 325 MG PO TABS
650.0000 mg | ORAL_TABLET | ORAL | Status: DC | PRN
  Administered 2019-03-28: 650 mg via ORAL
  Filled 2019-03-27: qty 2

## 2019-03-27 NOTE — Anesthesia Postprocedure Evaluation (Signed)
Anesthesia Post Note  Patient: Kaitlyn Costa Nurse  Procedure(s) Performed: Primary CESAREAN SECTION (N/A )     Patient location during evaluation: PACU Anesthesia Type: Spinal Level of consciousness: oriented and awake and alert Pain management: pain level controlled Vital Signs Assessment: post-procedure vital signs reviewed and stable Respiratory status: spontaneous breathing, respiratory function stable and nonlabored ventilation Cardiovascular status: blood pressure returned to baseline and stable Postop Assessment: no headache, no backache, no apparent nausea or vomiting, epidural receding and patient able to bend at knees Anesthetic complications: no    Last Vitals:  Vitals:   03/27/19 0015 03/27/19 0030  BP: 98/72 111/66  Pulse: (!) 58 (!) 59  Resp: (!) 22 18  Temp:  36.8 C  SpO2: 99% 99%    Last Pain:  Vitals:   03/27/19 0030  TempSrc: Oral  PainSc:    Pain Goal:    LLE Motor Response: Purposeful movement (03/27/19 0000) LLE Sensation: Tingling (03/27/19 0000) RLE Motor Response: Purposeful movement (03/27/19 0000) RLE Sensation: Tingling (03/27/19 0000)     Epidural/Spinal Function Cutaneous sensation: (P) Able to Discern Pressure (03/27/19 0030), Patient able to flex knees: (P) Yes (03/27/19 0030), Patient able to lift hips off bed: (P) Yes (03/27/19 0030), Back pain beyond tenderness at insertion site: (P) No (03/27/19 0030), Progressively worsening motor and/or sensory loss: (P) No (03/27/19 0030), Bowel and/or bladder incontinence post epidural: (P) No (03/27/19 0030)  Johari Bennetts A.
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
## 2019-03-27 NOTE — Lactation Note (Signed)
This note was copied from a baby's chart. Lactation Consultation Note  Patient Name: Kaitlyn Costa Today's Date: 03/27/2019 Reason for consult: 1st time breastfeeding;Initial assessment;Difficult latch P1, 8 hour female infant. Per mom, she breastfeed infant 5 times since birth. Mom has Spectra 2 DEBP at home. Mom was fitted with size 20 mm NS by Nurse. LC reviewed hand expression and infant was given 1 ml of colostrum by spoon. [place] asked mom pre-pump with harmony hand pump,she was then  fitted with 20 mm NS and mom latched infant on left breast using football hold position with 20 mm NS, infant mouth was wide with chin touching breast, infant was breastfeeding for 10 minutes. Infant was taken off breast  to be burped and colostrum was present in NS  Mom knows to breastfeed according hunger cues, 8 to 12 times within 24 hours and on demand. LC discussed I & O. Parents will do STS as much as possible. Mom made aware of O/P services, breastfeeding support groups, community resources, and our phone # for post-discharge questions.  Maternal Data Formula Feeding for Exclusion: No Has patient been taught Hand Expression?: Yes(Infant was given 1 ml of colostrum by spoon.) Does the patient have breastfeeding experience prior to this delivery?: No  Feeding Feeding Type: Breast Fed  LATCH Score Latch: Repeated attempts needed to sustain latch, nipple held in mouth throughout feeding, stimulation needed to elicit sucking reflex.  Audible Swallowing: A few with stimulation  Type of Nipple: Flat  Comfort (Breast/Nipple): Soft / non-tender  Hold (Positioning): Assistance needed to correctly position infant at breast and maintain latch.  LATCH Score: 6  Interventions Interventions: Breast feeding basics reviewed;Adjust position;Assisted with latch;Skin to skin;Support pillows;Breast massage;Position options;Hand express;Expressed milk;Pre-pump if needed;Shells;Hand pump  Lactation  Tools Discussed/Used Tools: Shells;Pump;Nipple Shields Nipple shield size: 20 Shell Type: Other (comment)(flat nipples) WIC Program: No Pump Review: Setup, frequency, and cleaning;Milk Storage Initiated by:: [name], IBCLC Date initiated:: 03/27/19   Consult Status Consult Status: Follow-up Date: 03/27/19 Follow-up type: In-patient    [name] 03/27/2019, 6:09 AM
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
## 2019-03-27 NOTE — Progress Notes (Signed)
POSTOPERATIVE DAY # 1 S/P Primary LTCS for breech presentation, GHTN, A2GDM, baby [place]   S:         Reports feeling well, some occasional burning at incision site with movement  Denies HA, visual changes, RUQ/epigastric pain              Tolerating po intake / no nausea / no vomiting / + flatus / no BM  Denies dizziness, SOB, or CP             Bleeding is light             Pain controlled with Motrin             Up ad lib / ambulatory/ no void since foley cath out 30 minutes ago  Newborn breast feeding - reports baby has been sleepy; only expressing a small amount of colostrum   O:  VS: BP 115/74 (BP Location: Left Arm)   Pulse 63   Temp 99 F (37.2 C) (Oral)   Resp 17   Ht 5\' 7"  (1.702 m)   Wt 102.2 kg   SpO2 98%   Breastfeeding Unknown   BMI 35.29 kg/m  03/27/19 1130  99 F (37.2 C)  63  -  17  115/74  Semi-fowlers  98 %  -  - ED   03/27/19 0700  98.9 F (37.2 C)  83  -  16  97/64  -  97 %  -  - SS   03/27/19 0631  98.1 F (36.7 C)  56Abnormal   -  20  -  -  97 %  -  - SS   03/27/19 0330  98.4 F (36.9 C)  61  -  16  101/64  -  97 %  -  - SS   03/27/19 0230  98 F (36.7 C)  61  -  16  99/65  -  98 %  -  - SS   03/27/19 0110  98.4 F (36.9 C)  60  -  16  112/75  -  98 %  -  - SS   03/27/19 0030  98.3 F (36.8 C)  59Abnormal   -  18  111/66  Lying  99 %  -  - SS      LABS:               Recent Labs    03/26/19 1733 03/27/19 0444  WBC 13.7* 10.7*  HGB 13.1 10.0*  PLT 250 173   Results for orders placed or performed during the hospital encounter of 03/26/19 (from the past 24 hour(s))  CBC     Status: Abnormal   Collection Time: 03/26/19  5:33 PM  Result Value Ref Range   WBC 13.7 (H) 4.0 - 10.5 K/uL   RBC 4.37 3.87 - 5.11 MIL/uL   Hemoglobin 13.1 12.0 - 15.0 g/dL   HCT 29.538.0 62.136.0 - 30.846.0 %   MCV 87.0 80.0 - 100.0 fL   MCH 30.0 26.0 - 34.0 pg   MCHC 34.5 30.0 - 36.0 g/dL   RDW 65.712.4 84.611.5 - 96.215.5 %   Platelets 250 150 - 400 K/uL   nRBC 0.0 0.0 - 0.2 %   Comprehensive metabolic panel     Status: Abnormal   Collection Time: 03/26/19  5:33 PM  Result Value Ref Range   Sodium 133 (L) 135 - 145 mmol/L   Potassium 3.8 3.5 - 5.1 mmol/L   Chloride  104 98 - 111 mmol/L   CO2 17 (L) 22 - 32 mmol/L   Glucose, Bld 85 70 - 99 mg/dL   BUN 16 6 - 20 mg/dL   Creatinine, Ser 1.610.70 0.44 - 1.00 mg/dL   Calcium 9.0 8.9 - 09.610.3 mg/dL   Total Protein 6.3 (L) 6.5 - 8.1 g/dL   Albumin 3.3 (L) 3.5 - 5.0 g/dL   AST 23 15 - 41 U/L   ALT 27 0 - 44 U/L   Alkaline Phosphatase 87 38 - 126 U/L   Total Bilirubin 0.8 0.3 - 1.2 mg/dL   GFR calc non Af Amer >60 >60 mL/min   GFR calc Af Amer >60 >60 mL/min   Anion gap 12 5 - 15  Type and screen [facility]     Status: None   Collection Time: 03/26/19  5:33 PM  Result Value Ref Range   ABO/RH(D) A POS    Antibody Screen NEG    Sample Expiration      03/29/2019,2359 Performed at [facility], [address] [place], [place] [address]   ABO/Rh     Status: None   Collection Time: 03/26/19  5:33 PM  Result Value Ref Range   ABO/RH(D) A POS    No rh immune globuloin      NOT A RH IMMUNE GLOBULIN CANDIDATE, PT RH POSITIVE Performed at [facility], [address] [place], [place] [address]   Glucose, capillary     Status: None   Collection Time: 03/26/19  5:38 PM  Result Value Ref Range   Glucose-Capillary 72 70 - 99 mg/dL  Protein / creatinine ratio, urine     Status: None   Collection Time: 03/26/19  6:03 PM  Result Value Ref Range   Creatinine, Urine 134.18 mg/dL   Total Protein, Urine 14 mg/dL   Protein Creatinine Ratio 0.10 0.00 - 0.15 mg/mg[Cre]  Glucose, capillary     Status: Abnormal   Collection Time: 03/26/19  6:44 PM  Result Value Ref Range   Glucose-Capillary 103 (H) 70 - 99 mg/dL  SARS Coronavirus 2 (CEPHEID - Performed in [facility] hospital lab), Hosp Order     Status: None   Collection Time: 03/26/19  7:27 PM   Specimen: Nasopharyngeal Swab  Result Value Ref  Range   SARS Coronavirus 2 NEGATIVE NEGATIVE  Glucose, capillary     Status: Abnormal   Collection Time: 03/26/19 10:37 PM  Result Value Ref Range   Glucose-Capillary 139 (H) 70 - 99 mg/dL  CBC     Status: Abnormal   Collection Time: 03/27/19  4:44 AM  Result Value Ref Range   WBC 10.7 (H) 4.0 - 10.5 K/uL   RBC 3.40 (L) 3.87 - 5.11 MIL/uL   Hemoglobin 10.0 (L) 12.0 - 15.0 g/dL   HCT 14.730.0 (L) 82.936.0 - 56.246.0 %   MCV 88.2 80.0 - 100.0 fL   MCH 29.4 26.0 - 34.0 pg   MCHC 33.3 30.0 - 36.0 g/dL   RDW 13.012.7 86.511.5 - 78.415.5 %   Platelets 173 150 - 400 K/uL   nRBC 0.0 0.0 - 0.2 %  Glucose, capillary     Status: Abnormal   Collection Time: 03/27/19  8:44 AM  Result Value Ref Range   Glucose-Capillary 142 (H) 70 - 99 mg/dL  Glucose, capillary     Status: Abnormal   Collection Time: 03/27/19  1:41 PM  Result Value Ref Range   Glucose-Capillary 142 (H)  70 - 99 mg/dL               Bloodtype: --/--/A POS, A POS (06/22 1733)  Rubella: Immune (12/20 0000)                                             I&O: Intake/Output      06/22 0701 - 06/23 0700 06/23 0701 - 06/24 0700   I.V. (mL/kg) 1467.9 (14.4) 1764.7 (17.3)   Total Intake(mL/kg) 1467.9 (14.4) 1764.7 (17.3)   Urine (mL/kg/hr) 425 1600 (1.9)   Blood 511    Total Output 936 1600   Net +531.9 +164.7                     Physical Exam:             Alert and Oriented X3  Lungs: Clear and unlabored  Heart: regular rate and rhythm / no murmurs  Abdomen: soft, non-tender, mild gaseous distention, active bowel sounds in all quadrants              Fundus: firm, non-tender, U-1             Dressing: honeycomb with steri-strips; pressure dressing in place, but honeycomb appears to be >50% saturated with sanguinous drainage              Incision:  approximated with sutures / no erythema / no ecchymosis / no active drainage  Perineum: intact  Lochia: scant amount on pad   Extremities: trace LE edema, no calf pain or tenderness,   A/P:     POD # 1  S/P Primary LTCS            Gestational Hypertension   - Not on meds    - BPs normal since delivery   - No neural s/s or evidence of PEC   A2GDM   - required insulin and Metformin during pregnancy, now on Metformin 500mg  PO daily   ABL Anemia     - Begin Niferex 150mg  PO daily tomorrow    - Magnesium oxide 400mg  PO daily   Routine postoperative care              Change honeycomb dressing once pressure dressing is removed   Breastfeeding education provided - advised to wake baby up for feeds; do not go longer than 3 hours without feeding; increase hydration; see lactation again   May shower today   Warm liquids and ambulation to promote bowel motility   Continue current care   Lars Pinks, MSN, CNM [facility]

## 2019-03-28 ENCOUNTER — Encounter (HOSPITAL_COMMUNITY): Payer: Self-pay | Admitting: Obstetrics & Gynecology

## 2019-03-28 LAB — GLUCOSE, CAPILLARY
Glucose-Capillary: 103 mg/dL — ABNORMAL HIGH (ref 70–99)
Glucose-Capillary: 111 mg/dL — ABNORMAL HIGH (ref 70–99)
Glucose-Capillary: 159 mg/dL — ABNORMAL HIGH (ref 70–99)
Glucose-Capillary: 93 mg/dL (ref 70–99)

## 2019-03-28 NOTE — Progress Notes (Signed)
POD# 2 s/p PCS breech, GDM, Ghtn  S: Pt notes pain controlled w/ po meds, minimal lochia, nl void, out of bed w/o dizziness or chest pain, tol reg po, + flatus. Pt is  Breastfeeding. No HA.  Vitals:   03/27/19 1130 03/27/19 1700 03/27/19 2219 03/28/19 0500  BP: 115/74 119/78 113/66 107/68  Pulse: 63 61 64 (!) 50  Resp: _0 Temp: 99 F (37.2 C) 98.8 F (37.1 C) 98 F (36.7 C) 98 F (36.7 C)  TempSrc: Oral Oral Oral Oral  SpO2: 98% 99%    Weight:      Height:        Gen: well appearing CV: RRR Pulm: CTAB Abd: soft, ND, approp tender, fundus below umbilicus, NT Inc: C/D/I,  LE: tr edema, NT, 1+ edema in L foot  CBC    Component Value Date/Time   WBC 10.7 (H) 03/27/2019 0444   RBC 3.40 (L) 03/27/2019 0444   HGB 10.0 (L) 03/27/2019 0444   HCT 30.0 (L) 03/27/2019 0444   PLT 173 03/27/2019 0444   MCV 88.2 03/27/2019 0444   MCH 29.4 03/27/2019 0444   MCHC 33.3 03/27/2019 0444   RDW 12.7 03/27/2019 0444   LYMPHSABS 1.5 01/13/2010 1209   MONOABS 0.8 01/13/2010 1209   EOSABS 0.0 01/13/2010 1209   BASOSABS 0.0 01/13/2010 1209    A/P: POD#  2 s/p PCS - post-op. Doing well.  - anemia, on iron - A2GDM, BS still in 140s, continue metformin. Plan 2 hrr GTT 6 wks PP - GHTN, no meds, nl bps -d/c home tomorrow  Ala Dach 03/28/2019 10:23 AM

## 2019-03-29 LAB — RPR: RPR Ser Ql: NONREACTIVE

## 2019-03-29 MED ORDER — POLYSACCHARIDE IRON COMPLEX 150 MG PO CAPS: 150.0000 mg | ORAL_CAPSULE | Freq: Every day | ORAL | 3 refills | Status: DC

## 2019-03-29 MED ORDER — METFORMIN HCL ER 500 MG PO TB24: 500.0000 mg | ORAL_TABLET | Freq: Every day | ORAL | 3 refills | Status: DC

## 2019-03-29 MED ORDER — IBUPROFEN 800 MG PO TABS: 800.0000 mg | ORAL_TABLET | Freq: Three times a day (TID) | ORAL | 0 refills | Status: DC

## 2019-03-29 MED ORDER — SENNOSIDES-DOCUSATE SODIUM 8.6-50 MG PO TABS: 2.0000 | ORAL_TABLET | Freq: Every day | ORAL | 0 refills | Status: DC
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
## 2019-03-29 NOTE — Lactation Note (Signed)
This note was copied from a baby's chart. Lactation Consultation Note  Patient Name: Girl Viana Sleep Today's Date: 03/29/2019 Reason for consult: Follow-up assessment;Difficult latch;Early term 71-38.6wks  P1 mother whose infant is now 55 hours old.  Baby was fussy when I arrived in the room.  Mother stated she had just finished feeding her.  I suggested she feed again and offered to assist.  Mother accepted.  Mother's breasts are soft and non tender.  She was wearing breast shells for flat nipples.  They appear to be helping evert nipple.  Assisted baby to latch in the football ;hold on the left breast without difficulty.  Explained how to obtain a deep latch which is something that mother has struggled with.  She observed a deep latch and audible swallows.  After a rhythmic sucking pattern, mother denied pain.  She was pleased to see baby feeding well.  Parents had many questions related to breast feeding and pumping  which I answered to their satisfaction.  Encouraged to feed 8-12 times/24 hours or sooner if baby shows feeding cues.  Encouraged limited (if any) use of formula and discussed the law of supply and demand with obtaining a good full milk supply.  Hand expression encouraged and breast compressions during feedings.    Mother has a DEBP for home use and will return to work in 71 weeks.  Suggested she consider returning for an OP [place] visit if she feels she needs assistance.  Mother may be interested in doing this.  I provided instructions on how to obtain a visit.  She has our OP phone number for questions after discharge.  Father present and supportive.   Maternal Data Formula Feeding for Exclusion: No Has patient been taught Hand Expression?: Yes Does the patient have breastfeeding experience prior to this delivery?: No  Feeding Feeding Type: Breast Fed  LATCH Score Latch: Grasps breast easily, tongue down, lips flanged, rhythmical sucking.  Audible Swallowing:  Spontaneous and intermittent  Type of Nipple: Everted at rest and after stimulation  Comfort (Breast/Nipple): Soft / non-tender  Hold (Positioning): Assistance needed to correctly position infant at breast and maintain latch.  LATCH Score: 9  Interventions Interventions: Breast feeding basics reviewed;Assisted with latch;Skin to skin;Breast massage;Breast compression;Adjust position;Shells;Position options;Support pillows  Lactation Tools Discussed/Used Tools: Shells;Coconut oil Nipple shield size: 20 Shell Type: Inverted WIC Program: No   Consult Status Consult Status: Complete Date: 03/29/19 Follow-up type: Call as needed    Dontai Pember R Kennede Lusk 03/29/2019, 1:47 PM

## 2019-03-29 NOTE — Discharge Summary (Signed)
Obstetric Discharge Summary   Patient Name: Kaitlyn Costa DOB: 1986-08-02 MRN: [id number]  Date of Admission: 03/26/2019 Date of Discharge: 03/29/2019 Date of Delivery: 03/26/2019 Gestational Age at Delivery: [redacted]w[redacted]d  Primary OB: [facility] - Dr. Benjie Karvonen  Antepartum complications:  Excessive weight gain in 1st trim prompted early GDM screen at 15 wks-> A1C 6.4 and Glucola 217 c/w GDM.  Lantus insulin and Metformin 750mg  XL started at 18 wks, Lantus gradually increased to 16 -18 units by 35 wks, adding Novolin attempted at 34 wks but had local allergic response to stopped. She stopped Lantus around 36 wks due to low CBG and having hypoglycemic feeling, but Metformin was continued.  Serial growth sono q 4 wks from 26 wks, baby remained AGA, AFI was up at 26 wks at 35 wks but decreased to normal range at f/up [facility] NSTs from 32 wks and BPP added from 34 wks - normal 10/10 score.   Other issues in pregnancy- incl mood changes with anger, poor sleep quality, vertigo, dizziness, fatigue, back pain.  Prenatal Labs:  ABO, Rh: A/Positive/-- (12/20 0000) Antibody: Negative (12/20 0000) Rubella: Immune (12/20 0000) RPR: Nonreactive (12/20 0000)  HBsAg: Negative (12/20 0000)  HIV: Non-reactive (12/20 0000)  GBS:  Negative  QUAD: normal, negative Glucola at 15 wks - 217   Admitting Diagnosis: PCS37.6 wks with A2GDM (Insulin and Metformin) and Breech, with elevated BPs c/w Gestational HTN.    Secondary Diagnoses: Patient Active Problem List   Diagnosis Date Noted  . Delivery by emergency cesarean 03/27/2019  . Breech presentation 03/27/2019  . Gestational hypertension 03/26/2019  . Frank breech presentation 03/26/2019  . Postpartum care following cesarean delivery (6/22) 03/26/2019  . IMPINGEMENT SYNDROME 04/09/2008  . ACROMIOCLAVICULAR SPRAIN AND STRAIN 04/09/2008  . SHOULDER PAIN 03/18/2008  . INFECTION, VIRAL NOS 12/09/2006  . DIABETES MELLITUS, TYPE II, UNCONTROLLED 12/09/2006   . HYPOTHYROIDISM 10/18/2006  . DIABETES MELLITUS, TYPE II 10/18/2006  . ANXIETY 10/18/2006  . MIGRAINE HEADACHE 10/18/2006  . GERD 10/18/2006  . IBS 10/18/2006    Date of Delivery: 03/26/2019 Delivered By: Dr. Benjie Karvonen Delivery Type: primary cesarean section, low transverse incision  Newborn Data: Live born female  Birth Weight: 7 lb 15.5 oz (3615 g) APGAR: 7, 8  Newborn Delivery   Birth date/time: 03/26/2019 21:42:00 Delivery type: C-Section, Low Transverse Trial of labor: No C-section categorization: Primary       Hospital/Postpartum Course  (Cesarean Section): 37.6 wks with A2GDM (Insulin and Metformin) and Breech, with elevated BPs c/w Gestational HTN for PCS. See notes and delivery summary/op note for details. Patient had an uncomplicated postpartum course.  By time of discharge on POD#3, her pain was controlled on oral pain medications; she had appropriate lochia and was ambulating, voiding without difficulty, tolerating regular diet and passing flatus.   She was deemed stable for discharge to home.     Labs: CBC Latest Ref Rng & Units 03/27/2019 03/26/2019 10/02/2017  WBC 4.0 - 10.5 K/uL 10.7(H) 13.7(H) 14.4(H)  Hemoglobin 12.0 - 15.0 g/dL 10.0(L) 13.1 14.3  Hematocrit 36.0 - 46.0 % 30.0(L) 38.0 41.1  Platelets 150 - 400 K/uL 173 250 230   A POS  Physical exam:  BP (!) 145/82 (BP Location: Right Arm)   Pulse 69   Temp 98.2 F (36.8 C) (Oral)   Resp 16   Ht 5\' 7"  (1.702 m)   Wt 102.2 kg   SpO2 100%   Breastfeeding Unknown   BMI 35.29 kg/m  Alert and Oriented  X3  Lungs: Clear and unlabored  Heart: regular rate and rhythm / no murmurs  Abdomen: soft, non-tender, non-distended, obese, active bowel sounds             Fundus: firm, non-tender, U-2             Dressing: honeycomb with steri-strips              Incision:  approximated with sutures / no erythema / no ecchymosis / no drainage  Perineum: intact  Lochia: small, no clots   Extremities: +1 pitting right  pedal edema , +2 left pedal edema; no calf pain or tenderness, +2 DTRs bilaterally, no clonus bilaterally   Disposition: stable, discharge to home Baby Feeding: breast milk Baby Disposition: home with mom  Contraception: undecided  Rh Immune globulin given: N/A Rubella vaccine given: N/A Tdap vaccine given in AP or PP setting: UTD Flu vaccine given in AP or PP setting: not on file   Plan:  Kaitlyn Costa was discharged to home in good condition. [facility] for BP check.  Discharge Instructions: Per After Visit Summary. Refer to After Visit Summary and [facility]  Activity: Advance as tolerated. Pelvic rest for 6 weeks.   Diet: Regular, Heart Healthy Discharge Medications: Allergies as of 03/29/2019      Reactions   Augmentin [amoxicillin-pot Clavulanate] Shortness Of Breath, Rash   Avelox [moxifloxacin Hcl In Nacl] Itching, Swelling      Medication List    STOP taking these medications   ondansetron 4 MG tablet Commonly known as: ZOFRAN     TAKE these medications   ibuprofen 800 MG tablet Commonly known as: ADVIL Take 1 tablet (800 mg total) by mouth every 8 (eight) hours. What changed:   medication strength  how much to take  when to take this  reasons to take this   iron polysaccharides 150 MG capsule Commonly known as: NIFEREX Take 1 capsule (150 mg total) by mouth daily. Start taking on: March 30, 2019   metFORMIN 500 MG 24 hr tablet Commonly known as: Glucophage XR Take 1 tablet (500 mg total) by mouth daily with breakfast.   senna-docusate 8.6-50 MG tablet Commonly known as: Senokot-S Take 2 tablets by mouth at bedtime.   Vitamin D3 125 MCG (5000 UT) Caps Take 5,000 Units by mouth 2 (two) times daily.      Outpatient follow up:  Follow-up Information    Shea EvansMody, Vaishali, MD. Schedule an appointment as soon as possible for a visit in 1 week(s).   Specialty: Obstetrics and  Gynecology Why: Blood pressure check; then 6 week PP visit  Contact information: [address] [place] [address] [phone]           Signed:  Carlean JewsMeredith Aaima Gaddie, MSN, CNM [facility]

## 2019-03-30 ENCOUNTER — Other Ambulatory Visit (HOSPITAL_COMMUNITY)
Admission: RE | Admit: 2019-03-30 | Discharge: 2019-03-30 | Disposition: A | Discharge status: Home / Self Care | Payer: BC Managed Care – PPO | Source: Ambulatory Visit

## 2019-03-30 HISTORY — DX: Headache, unspecified: R51.9

## 2019-03-30 HISTORY — DX: Other complications of anesthesia, initial encounter: T88.59XA

## 2019-03-30 HISTORY — DX: Type 2 diabetes mellitus without complications: E11.9

## 2019-03-30 HISTORY — DX: Gastro-esophageal reflux disease without esophagitis: K21.9

## 2019-03-30 HISTORY — DX: Gestational diabetes mellitus in pregnancy, unspecified control: O24.419

## 2019-04-03 ENCOUNTER — Inpatient Hospital Stay (HOSPITAL_COMMUNITY)
Admission: RE | Admit: 2019-04-03 | Payer: BC Managed Care – PPO | Source: Home / Self Care | Admitting: Obstetrics & Gynecology

## 2019-06-06 ENCOUNTER — Encounter: Payer: Self-pay | Admitting: Orthopedic Surgery

## 2019-06-06 ENCOUNTER — Other Ambulatory Visit: Payer: Self-pay

## 2019-06-06 ENCOUNTER — Ambulatory Visit: Payer: BC Managed Care – PPO

## 2019-06-06 ENCOUNTER — Other Ambulatory Visit: Payer: Self-pay | Admitting: Orthopedic Surgery

## 2019-06-06 ENCOUNTER — Ambulatory Visit (INDEPENDENT_AMBULATORY_CARE_PROVIDER_SITE_OTHER): Payer: BC Managed Care – PPO | Admitting: Orthopedic Surgery

## 2019-06-06 VITALS — BP 132/82 | HR 75 | Ht 67.0 in | Wt 215.0 lb

## 2019-06-06 DIAGNOSIS — M79645 Pain in left finger(s): Secondary | ICD-10-CM | POA: Diagnosis not present

## 2019-06-06 DIAGNOSIS — M79644 Pain in right finger(s): Secondary | ICD-10-CM

## 2019-06-06 DIAGNOSIS — M654 Radial styloid tenosynovitis [de Quervain]: Secondary | ICD-10-CM

## 2019-06-06 NOTE — Progress Notes (Signed)
[place]  06/06/2019  HISTORY SECTION :  Chief Complaint  Patient presents with  . Hand Pain    left thumb pain slipped 6weeks ago    HPI The patient presents for evaluation of left thumb after falling 6 weeks ago also 1-month-old child.  She has severe pain left thumb questionable locking catching giving out.  Feels like something is loose.  Location left thumb Duration 6 weeks Quality dull ache Severity 8 Associated with giving way  Review of Systems  Constitutional: Negative for chills and fever.  Musculoskeletal: Positive for joint pain.  Skin: Negative.   Neurological: Negative for tingling.     Past Medical History:  Diagnosis Date  . Complication of anesthesia    locals dont numb very well  . Diabetes mellitus without complication ([place])   . Dysmenorrhea   . Ectopic pregnancy    treated with medication  . Frank breech presentation 03/26/2019  . GERD (gastroesophageal reflux disease)   . Gestational diabetes   . Headache   . Herniated disc, cervical   . History of diabetes mellitus   . History of genital warts   . HPV in female     Past Surgical History:  Procedure Laterality Date  . CESAREAN SECTION N/A 03/26/2019   Procedure: Primary CESAREAN SECTION;  Surgeon: Azucena Fallen, MD;  Location: [facility];  Service: Obstetrics;  Laterality: N/A;  EDD: 04/10/19 Allergy: Augmentin, Novolin, Avelox  . CRANIOTOMY    . SHOULDER SURGERY    . skull surgery    . TONSILLECTOMY       Allergies  Allergen Reactions  . Augmentin [Amoxicillin-Pot Clavulanate] Shortness Of Breath and Rash  . Avelox [Moxifloxacin Hcl In Nacl] Itching and Swelling     Current Outpatient Medications:  .  buPROPion (WELLBUTRIN XL) 150 MG 24 hr tablet, Take 150 mg by mouth daily., Disp: , Rfl:  .  Cholecalciferol (VITAMIN D3) 5000 units CAPS, Take 5,000 Units by mouth 2 (two) times daily., Disp: , Rfl:  .  ibuprofen (ADVIL) 800 MG tablet, Take 1 tablet (800 mg total) by mouth  every 8 (eight) hours., Disp: 30 tablet, Rfl: 0 .  iron polysaccharides (NIFEREX) 150 MG capsule, Take 1 capsule (150 mg total) by mouth daily., Disp: 30 capsule, Rfl: 3 .  metFORMIN (GLUCOPHAGE XR) 500 MG 24 hr tablet, Take 1 tablet (500 mg total) by mouth daily with breakfast., Disp: 30 tablet, Rfl: 3 .  nystatin cream (MYCOSTATIN), Apply 1 application topically 2 (two) times daily., Disp: , Rfl:  .  nystatin-triamcinolone (MYCOLOG II) cream, APPLY CREAM TOPICALLY TO AFFECTED AREA TWICE DAILY FOR 10 DAYS AS NEEDED, Disp: , Rfl:  .  senna-docusate (SENOKOT-S) 8.6-50 MG tablet, Take 2 tablets by mouth at bedtime., Disp: 30 tablet, Rfl: 0   PHYSICAL EXAM SECTION: 1) BP 132/82   Pulse 75   Ht 5\' 7"  (1.702 m)   Wt 215 lb (97.5 kg)   BMI 33.67 kg/m   Body mass index is 33.67 kg/m. General appearance: Well-developed well-nourished no gross deformities  2) Cardiovascular normal pulse and perfusion in the upper extremities normal color without edema  3) Neurologically deep tendon reflexes are equal and normal, no sensation loss or deficits no pathologic reflexes  4) Psychological: Awake alert and oriented x3 mood and affect normal  5) Skin no lacerations or ulcerations no nodularity no palpable masses, no erythema or nodularity  6) Musculoskeletal:   Left thumb is tender over the first extensor compartment She  has painful range of motion positive Finkelstein test  No strength deficit  No instability  MEDICAL DECISION SECTION:  Encounter Diagnoses  Name Primary?  . Thumb pain, right Yes  . De Quervain's tenosynovitis, left     Imaging See report x-ray shows no fracture dislocation or acute injury  Plan:  (Rx., Inj., surg., Frx, MRI/CT, XR:2)  De Quervain's syndrome injection   Procedure note injection left wrist for de Quervain's syndrome The patient has consented for injection of THE Left wrist for de Quervain's syndrome 40 mg of Depo-Medrol 1 mL, 3 mL 1% lidocaine.  Patient gave verbal consent timeout to confirm site of injection  Sterile technique ethyl chloride used for skin prep  No complications  Splint  X-ray 6 weeks  4:54 PM Fuller CanadaStanley Chasidy Janak, MD  06/06/2019

## 2019-06-06 NOTE — Patient Instructions (Addendum)
De Quervain's Tenosynovitis  De Quervain's tenosynovitis is a condition that causes inflammation of the tendon on the thumb side of the wrist. Tendons are cords of tissue that connect bones to muscles. The tendons in the hand pass through a tunnel called a sheath. A slippery layer of tissue (synovium) lets the tendons move smoothly in the sheath. With de Quervain's tenosynovitis, the sheath swells or thickens, causing friction and pain. The condition is also called de Quervain's disease and de Quervain's syndrome. It occurs most often in women who are 30-50 years old. What are the causes? The exact cause of this condition is not known. It may be associated with overuse of the hand and wrist. What increases the risk? You are more likely to develop this condition if you:  Use your hands far more than normal, especially if you repeat certain movements that involve twisting your hand or using a tight grip.  Are pregnant.  Are a middle-aged woman.  Have rheumatoid arthritis.  Have diabetes. What are the signs or symptoms? The main symptom of this condition is pain on the thumb side of the wrist. The pain may get worse when you grasp something or turn your wrist. Other symptoms may include:  Pain that extends up the forearm.  Swelling of your wrist and hand.  Trouble moving the thumb and wrist.  A sensation of snapping in the wrist.  A bump filled with fluid (cyst) in the area of the pain. How is this diagnosed? This condition may be diagnosed based on:  Your symptoms and medical history.  A physical exam. During the exam, your health care provider may do a simple test (Finkelstein test) that involves pulling your thumb and wrist to see if this causes pain. You may also need to have an X-ray. How is this treated? Treatment for this condition may include:  Avoiding any activity that causes pain and swelling.  Taking medicines. Anti-inflammatory medicines and corticosteroid  injections may be used to reduce inflammation and relieve pain.  Wearing a splint.  Having surgery. This may be needed if other treatments do not work. Once the pain and swelling has gone down:  Physical therapy. This includes stretching and strengthening exercises.  Occupational therapy. This includes adjusting how you move your wrist. Follow these instructions at home: If you have a splint:  Wear the splint as told by your health care provider. Remove it only as told by your health care provider.  Loosen the splint if your fingers tingle, become numb, or turn cold and blue.  Keep the splint clean.  If the splint is not waterproof: ? Do not let it get wet. ? Cover it with a watertight covering when you take a bath or a shower. Managing pain, stiffness, and swelling   Avoid movements and activities that cause pain and swelling in the wrist area.  If directed, put ice on the painful area. This may be helpful after doing activities that involve the sore wrist. ? Put ice in a plastic bag. ? Place a towel between your skin and the bag. ? Leave the ice on for 20 minutes, 2-3 times a day.  Move your fingers often to avoid stiffness and to lessen swelling.  Raise (elevate) the injured area above the level of your heart while you are sitting or lying down. General instructions  Return to your normal activities as told by your health care provider. Ask your health care provider what activities are safe for you.  Take over-the-counter   and prescription medicines only as told by your health care provider.  Keep all follow-up visits as told by your health care provider. This is important. Contact a health care provider if:  Your pain medicine does not help.  Your pain gets worse.  You develop new symptoms. Summary  De Quervain's tenosynovitis is a condition that causes inflammation of the tendon on the thumb side of the wrist.  The condition occurs most often in women who are  68-19 years old.  The exact cause of this condition is not known. It may be associated with overuse of the hand and wrist.  Treatment starts with avoiding activity that causes pain or swelling in the wrist area. Other treatment may include wearing a splint and taking medicine. Sometimes, surgery is needed. This information is not intended to replace advice given to you by your health care provider. Make sure you discuss any questions you have with your health care provider. Document Released: 06/15/2001 Document Revised: 03/23/2018 Document Reviewed: 08/29/2017 Elsevier Patient Education  2020 [place] OTC NSAIDS //IBUPROFEN OR ALEVE   ICE 2 X A DAY   WEAR SPLINT 6 WEEKS  You have received an injection of steroids into the joint. 15% of patients will have increased pain within the 24 hours postinjection.   This is transient and will go away.   We recommend that you use ice packs on the injection site for 20 minutes every 2 hours and extra strength Tylenol 2 tablets every 8 as needed until the pain resolves.  If you continue to have pain after taking the Tylenol and using the ice please call the office for further instructions.

## 2019-06-18 ENCOUNTER — Encounter: Payer: Self-pay | Admitting: Orthopedic Surgery

## 2019-06-18 ENCOUNTER — Telehealth: Payer: Self-pay | Admitting: Orthopedic Surgery

## 2019-06-18 NOTE — Telephone Encounter (Signed)
ok 

## 2019-06-18 NOTE — Telephone Encounter (Signed)
Patient aware; verbal authorization received; note faxed to patient's employer, [employer] at [employer], [phone] to attention: Brandi.

## 2019-06-18 NOTE — Telephone Encounter (Signed)
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 States she is advised to remain out of work until Dr Aline Brochure re-evaluates/releases her (next scheduled visit 07/23/19).

## 2019-06-18 NOTE — Telephone Encounter (Signed)
Called back to patient to notify; left message on secure voice mail as requested. Authorization to follow if to fax to employer.

## 2019-07-11 ENCOUNTER — Encounter: Payer: Self-pay | Admitting: Orthopedic Surgery

## 2019-07-11 ENCOUNTER — Other Ambulatory Visit: Payer: Self-pay

## 2019-07-11 ENCOUNTER — Ambulatory Visit (INDEPENDENT_AMBULATORY_CARE_PROVIDER_SITE_OTHER): Payer: BC Managed Care – PPO | Admitting: Orthopedic Surgery

## 2019-07-11 ENCOUNTER — Telehealth: Payer: Self-pay | Admitting: Orthopedic Surgery

## 2019-07-11 VITALS — BP 118/82 | HR 71 | Ht 67.0 in | Wt 215.0 lb

## 2019-07-11 DIAGNOSIS — M654 Radial styloid tenosynovitis [de Quervain]: Secondary | ICD-10-CM

## 2019-07-11 NOTE — Patient Instructions (Signed)
Return as needed

## 2019-07-11 NOTE — Progress Notes (Signed)
Encounter Diagnosis  Name Primary?  Tennis Must Quervain's tenosynovitis, left Yes    33 year old female mother of a 60-week-old presents back after injection for de Quervain's syndrome completely free of any symptoms.  She says after 4 days she was fine  Her de Erskine Emery test is normal  Follow-up as needed  Encounter Diagnosis  Name Primary?  Tennis Must Quervain's tenosynovitis, left Yes

## 2019-07-11 NOTE — Telephone Encounter (Signed)
Per patient's request, work note faxed to patient's employer, [employer] at [facility] [employer] [facility], [phone] to attention: Velna Hatchet, per verbal authorization received.

## 2019-07-23 ENCOUNTER — Ambulatory Visit: Payer: BC Managed Care – PPO | Admitting: Orthopedic Surgery

## 2020-05-19 ENCOUNTER — Other Ambulatory Visit: Payer: Self-pay | Admitting: Family Medicine

## 2020-05-19 DIAGNOSIS — R221 Localized swelling, mass and lump, neck: Secondary | ICD-10-CM

## 2020-05-26 ENCOUNTER — Other Ambulatory Visit: Payer: Self-pay

## 2020-05-26 ENCOUNTER — Ambulatory Visit (INDEPENDENT_AMBULATORY_CARE_PROVIDER_SITE_OTHER): Payer: 59 | Admitting: Orthopedic Surgery

## 2020-05-26 VITALS — BP 126/80 | HR 76 | Ht 67.0 in | Wt 215.0 lb

## 2020-05-26 DIAGNOSIS — M654 Radial styloid tenosynovitis [de Quervain]: Secondary | ICD-10-CM | POA: Diagnosis not present

## 2020-05-26 NOTE — Progress Notes (Signed)
Chief Complaint  Patient presents with  . Follow-up    Recheck on left Kaitlyn Costa    34 year old female with de Quervain's syndrome got good relief from injection to is about 8 months then the symptoms came back worsening over the last 2 months  We discussed possible reinjection versus surgery.   Procedure note injection left wrist for de Quervain's syndrome The patient has consented for injection of THE Left wrist for de Quervain's syndrome 40 mg of Depo-Medrol 1 mL, 3 mL 1% lidocaine. Patient gave verbal consent timeout to confirm site of injection  Sterile technique ethyl chloride used for skin prep  No complications  Return 4 weeks if no improvement surgery

## 2020-05-26 NOTE — Patient Instructions (Signed)
De Quervain's Tenosynovitis  De Quervain's tenosynovitis is a condition that causes inflammation of the tendon on the thumb side of the wrist. Tendons are cords of tissue that connect bones to muscles. The tendons in the hand pass through a tunnel called a sheath. A slippery layer of tissue (synovium) lets the tendons move smoothly in the sheath. With de Quervain's tenosynovitis, the sheath swells or thickens, causing friction and pain. The condition is also called de Quervain's disease and de Quervain's syndrome. It occurs most often in women who are 30-50 years old. What are the causes? The exact cause of this condition is not known. It may be associated with overuse of the hand and wrist. What increases the risk? You are more likely to develop this condition if you:  Use your hands far more than normal, especially if you repeat certain movements that involve twisting your hand or using a tight grip.  Are pregnant.  Are a middle-aged woman.  Have rheumatoid arthritis.  Have diabetes. What are the signs or symptoms? The main symptom of this condition is pain on the thumb side of the wrist. The pain may get worse when you grasp something or turn your wrist. Other symptoms may include:  Pain that extends up the forearm.  Swelling of your wrist and hand.  Trouble moving the thumb and wrist.  A sensation of snapping in the wrist.  A bump filled with fluid (cyst) in the area of the pain. How is this diagnosed? This condition may be diagnosed based on:  Your symptoms and medical history.  A physical exam. During the exam, your health care provider may do a simple test (Finkelstein test) that involves pulling your thumb and wrist to see if this causes pain. You may also need to have an X-ray. How is this treated? Treatment for this condition may include:  Avoiding any activity that causes pain and swelling.  Taking medicines. Anti-inflammatory medicines and corticosteroid  injections may be used to reduce inflammation and relieve pain.  Wearing a splint.  Having surgery. This may be needed if other treatments do not work. Once the pain and swelling has gone down:  Physical therapy. This includes stretching and strengthening exercises.  Occupational therapy. This includes adjusting how you move your wrist. Follow these instructions at home: If you have a splint:  Wear the splint as told by your health care provider. Remove it only as told by your health care provider.  Loosen the splint if your fingers tingle, become numb, or turn cold and blue.  Keep the splint clean.  If the splint is not waterproof: ? Do not let it get wet. ? Cover it with a watertight covering when you take a bath or a shower. Managing pain, stiffness, and swelling   Avoid movements and activities that cause pain and swelling in the wrist area.  If directed, put ice on the painful area. This may be helpful after doing activities that involve the sore wrist. ? Put ice in a plastic bag. ? Place a towel between your skin and the bag. ? Leave the ice on for 20 minutes, 2-3 times a day.  Move your fingers often to avoid stiffness and to lessen swelling.  Raise (elevate) the injured area above the level of your heart while you are sitting or lying down. General instructions  Return to your normal activities as told by your health care provider. Ask your health care provider what activities are safe for you.  Take over-the-counter   and prescription medicines only as told by your health care provider.  Keep all follow-up visits as told by your health care provider. This is important. Contact a health care provider if:  Your pain medicine does not help.  Your pain gets worse.  You develop new symptoms. Summary  De Quervain's tenosynovitis is a condition that causes inflammation of the tendon on the thumb side of the wrist.  The condition occurs most often in women who are  34-70 years old.  The exact cause of this condition is not known. It may be associated with overuse of the hand and wrist.  Treatment starts with avoiding activity that causes pain or swelling in the wrist area. Other treatment may include wearing a splint and taking medicine. Sometimes, surgery is needed. This information is not intended to replace advice given to you by your health care provider. Make sure you discuss any questions you have with your health care provider. Document Revised: 03/23/2018 Document Reviewed: 08/29/2017 Elsevier Patient Education  2020 [employer]  Tommi Rumps Quervain's Tenosynovitis Surgical Release  De Quervain's tenosynovitis surgical release is a procedure to relieve pressure and pain in the thumb and wrist caused by de Quervain's tenosynovitis. This is a condition in which the lining (sheath) that wraps around cords of tissue (tendons) in the wrist becomes irritated, thickened, and swollen. This causes pain on the thumb side of the wrist. During this procedure, the sheath around the tendons will be cut (released). This will allow the tendons to move more freely. The goal of the procedure is to relieve pain and pressure and to improve range of motion of the thumb and wrist. Tell a health care provider about:  Any allergies you have.  All medicines you are taking, including vitamins, herbs, eye drops, creams, and over-the-counter medicines.  Any problems you or family members have had with anesthetic medicines.  Any blood disorders you have, including any history of blood clots.  Any surgeries you have had.  Any medical conditions you have or have had.  Whether you are pregnant or may be pregnant. What are the risks? Generally, this is a safe procedure. However, problems may occur, including:  Infection.  Bleeding.  Allergic reactions to medicines.  Damage to other structures or organs, such as nerves in the hand or wrist. This is rare.  Pain or  stiffness returning to the thumb. What happens before the procedure? Staying hydrated Follow instructions from your health care provider about hydration, which may include:  Up to 2 hours before the procedure - you may continue to drink clear liquids, such as water, clear fruit juice, black coffee, and plain tea. Eating and drinking restrictions Follow instructions from your health care provider about eating and drinking, which may include:  8 hours before the procedure - stop eating heavy meals or foods, such as meat, fried foods, or fatty foods.  6 hours before the procedure - stop eating light meals or foods, such as toast or cereal.  6 hours before the procedure - stop drinking milk or drinks that contain milk.  2 hours before the procedure - stop drinking clear liquids. Medicines Ask your health care provider about:  Changing or stopping your regular medicines. This is especially important if you are taking diabetes medicines or blood thinners.  Taking medicines such as aspirin and ibuprofen. These medicines can thin your blood. Do not take these medicines unless your health care provider tells you to take them.  Taking over-the-counter medicines, vitamins, herbs, and  supplements. Tests You may have:  Tests, such as: ? X-rays of your hand and wrist. ? Blood or urine tests.  A physical exam. General instructions  Do not use any products that contain nicotine or tobacco for at least 4 weeks before the procedure. These products include cigarettes, e-cigarettes, and chewing tobacco. If you need help quitting, ask your health care provider.  Ask your health care provider: ? How your surgery site will be marked. ? What steps will be taken to help prevent infection. This may include washing skin with a germ-killing soap.  Plan to have someone take you home from the hospital or clinic. What happens during the procedure?      An IV will be inserted into one of your  veins.  You may be given one or more of the following: ? A medicine to help you relax (sedative). ? A medicine to numb the area (local anesthetic). ? A medicine that is injected into an area of your body to numb everything below the injection site (regional anesthetic).  An incision will be made in your wrist, near the base of your thumb.  An incision will be made in the sheath that covers the tendons, and the sheath will be opened.  If you have any fluid-filled sacs (cysts) or other inflamed tissues, they will be removed.  The incision will be closed with stitches (sutures) and covered with a bandage (dressing). The procedure may vary among health care providers and hospitals. What happens after the procedure?  A splint or brace will be put on your wrist and thumb area to keep it from moving.  Your blood pressure, heart rate, breathing rate, and blood oxygen level will be monitored until you leave the hospital or clinic.  You will have some pain. Medicines will be available to help you.  Do not drive for 24 hours if you were given a sedative during your procedure. Summary  During this procedure, the sheath around the tendons will be cut (released). This relieves pressure on the tendons.  Follow instructions from your health care provider about taking medicines and about eating and drinking before the procedure.  Plan to have someone take you home from the hospital or clinic.  After the procedure, a splint or brace will be put on your wrist and thumb area to keep it from moving. This information is not intended to replace advice given to you by your health care provider. Make sure you discuss any questions you have with your health care provider. Document Revised: 12/04/2018 Document Reviewed: 12/04/2018 Elsevier Patient Education  2020 [employer].

## 2020-05-28 ENCOUNTER — Ambulatory Visit
Admission: RE | Admit: 2020-05-28 | Discharge: 2020-05-28 | Disposition: A | Discharge status: Home / Self Care | Payer: 59 | Source: Ambulatory Visit | Attending: Family Medicine | Admitting: Family Medicine

## 2020-05-28 DIAGNOSIS — R221 Localized swelling, mass and lump, neck: Secondary | ICD-10-CM

## 2020-06-05 ENCOUNTER — Telehealth: Payer: Self-pay | Admitting: Psychiatry

## 2020-06-05 NOTE — Telephone Encounter (Signed)
Error not needed

## 2020-06-26 ENCOUNTER — Ambulatory Visit: Payer: 59 | Admitting: Orthopedic Surgery

## 2020-07-11 ENCOUNTER — Ambulatory Visit (INDEPENDENT_AMBULATORY_CARE_PROVIDER_SITE_OTHER): Payer: 59 | Admitting: Psychiatry

## 2020-07-11 ENCOUNTER — Encounter: Payer: Self-pay | Admitting: Psychiatry

## 2020-07-11 ENCOUNTER — Other Ambulatory Visit: Payer: Self-pay

## 2020-07-11 VITALS — BP 125/87 | HR 75 | Ht 65.0 in | Wt 174.0 lb

## 2020-07-11 DIAGNOSIS — F39 Unspecified mood [affective] disorder: Secondary | ICD-10-CM | POA: Diagnosis not present

## 2020-07-11 DIAGNOSIS — F4310 Post-traumatic stress disorder, unspecified: Secondary | ICD-10-CM | POA: Diagnosis not present
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
## 2020-07-12 ENCOUNTER — Encounter: Payer: Self-pay | Admitting: Psychiatry

## 2020-08-04 ENCOUNTER — Telehealth: Payer: Self-pay | Admitting: Psychiatry

## 2020-08-04 NOTE — Telephone Encounter (Signed)
Please let her know we received the results of her Genesight testing and I will go over them in detail during her apt and give her a copy.   Results indicated an MTHFR mutation which means she may benefit from a methylated form of folic acid. We can pull samples of Deplin for her to start now or she can wait until her apt and I can explain more then. It can be taken morning or night, with or without food.   She has normal metabolism through most pathways in the liver. Most of the other results were unremarkable.

## 2020-08-04 NOTE — Telephone Encounter (Signed)
Pt would like to know if her Genesight testing has came back yet. Pt said that she does not get good cell phone service its best if you send a  text messages or email. Please call.

## 2020-08-04 NOTE — Telephone Encounter (Signed)
Next apt 08/15/20

## 2020-08-05 NOTE — Telephone Encounter (Signed)
Rtc to patient and discussed results and recommendation. She will wait till her apt to pick up samples of Deplin and discuss further.

## 2020-08-06 ENCOUNTER — Ambulatory Visit: Payer: 59 | Admitting: Psychiatry

## 2020-08-15 ENCOUNTER — Ambulatory Visit (INDEPENDENT_AMBULATORY_CARE_PROVIDER_SITE_OTHER): Payer: 59 | Admitting: Psychiatry

## 2020-08-15 ENCOUNTER — Other Ambulatory Visit: Payer: Self-pay

## 2020-08-15 ENCOUNTER — Encounter: Payer: Self-pay | Admitting: Psychiatry

## 2020-08-15 DIAGNOSIS — F39 Unspecified mood [affective] disorder: Secondary | ICD-10-CM

## 2020-08-15 DIAGNOSIS — Z1589 Genetic susceptibility to other disease: Secondary | ICD-10-CM

## 2020-08-15 DIAGNOSIS — F4310 Post-traumatic stress disorder, unspecified: Secondary | ICD-10-CM

## 2020-08-15 MED ORDER — DEPLIN 15 15-90.314 MG PO CAPS: 15.0000 mg | ORAL_CAPSULE | Freq: Every day | ORAL | 0 refills | Status: DC

## 2020-08-15 NOTE — Progress Notes (Signed)
Kaitlyn Costa [id number] [birth year] 34 y.o.  Subjective:   Patient ID:  Kaitlyn Costa is a 34 y.o. (DOB November 22, 1985) female.  Chief Complaint:  Chief Complaint  Patient presents with  . Depression  . Anxiety  . Other    Irritability    HPI Kaitlyn Costa presents to the office today for follow-up of mood disturbance and anxiety. She reports that she had difficulty with mood charting, partly due to mood changing during the course of the day. She reports that her mood will change in response to how others are treating. She reports that several stressors and irritants "can build until I explode" and then says things she regrets and then feels guilty about her behavior. She reports that she tries to use self-talk and work through responses. Describes feeling "numb, detached... depressed and angry more than I feel happy." She reports that mood has been more detached and depressed in the last few weeks. She reports that her emotions "come in waves." She reports long-standing "anger issue" and that anger would happen in response to an identifiable trigger. She reports that anxiety has been new since she had serotonin syndrome. Has not had a panic attack since last visit. Will use different strategies to manage anxiety. Will over-think and replay scenarios. Occasional heart palpitations with anxiety. Sleep has been up and down. Sleep also disrupted with baby awakening. She reports that she is an "emotional eater" and will eat a normal amount when mood is stable, does not eat when she is sad, and overeat when she is angry or "upset." She reports poor concentration and has difficulty with multi-tasking. Reports difficulty with time management.  Describes feeling overly-stimulated and easily startled. She reports having some passive death wishes. Denies SI.   She reports that she is concerned about starting medication after serotonin syndrome with Zoloft.   Past Psychiatric Medication Trials: Reports  h/o med sensitivity and serotonin syndrome Zoloft- Serotonin syndrome. Started having mood lability. Had increased sweats, heart racing, elevated mood, increased goal-directed activity and then had severe anxiety and panic. Wellbutrin XL- insomnia, night sweats, night terrors, felt sped up    Review of Systems:  Review of Systems  Constitutional:       Hair loss  Gastrointestinal: Positive for abdominal distention.  Musculoskeletal: Negative for gait problem.  Psychiatric/Behavioral:       Please refer to HPI    Medications: I have reviewed the patient's current medications.  Current Outpatient Medications  Medication Sig Dispense Refill  . levonorgestrel (MIRENA) 20 MCG/24HR IUD 1 each by Intrauterine route once.    Marland Kitchen L-Methylfolate-Algae (DEPLIN 15) 15-90.314 MG CAPS Take 15 mg by mouth daily. 30 capsule 0  . Multiple Vitamins-Minerals (WOMENS MULTIVITAMIN PO) Take by mouth. (Patient not taking: Reported on 08/15/2020)     No current facility-administered medications for this visit.    Medication Side Effects: Other: N/A  Allergies:  Allergies  Allergen Reactions  . Augmentin [Amoxicillin-Pot Clavulanate] Shortness Of Breath and Rash  . Avelox [Moxifloxacin Hcl In Nacl] Itching and Swelling  . Bupropion   . Sertraline     Serotonin syndrome    Past Medical History:  Diagnosis Date  . Complication of anesthesia    locals dont numb very well  . Diabetes mellitus without complication (HCC)   . Dysmenorrhea   . Ectopic pregnancy    treated with medication  . Frank breech presentation 03/26/2019  . GERD (gastroesophageal reflux disease)   . Gestational diabetes   .  Headache   . Herniated disc, cervical   . History of diabetes mellitus   . History of genital warts   . HPV in female   . PCOS (polycystic ovarian syndrome)   . Thyroid disease     Family History  Problem Relation Age of Onset  . Diabetes Maternal Grandmother   . Diabetes Maternal Grandfather   .  Anxiety disorder Maternal Grandfather   . Panic disorder Maternal Grandfather   . Depression Mother   . Schizophrenia Father   . Alcohol abuse Father     Social History   Socioeconomic History  . Marital status: Married    Spouse name: Not on file  . Number of children: Not on file  . Years of education: Not on file  . Highest education level: Not on file  Occupational History  . Not on file  Tobacco Use  . Smoking status: Never Smoker  . Smokeless tobacco: Never Used  Vaping Use  . Vaping Use: Never used  Substance and Sexual Activity  . Alcohol use: Yes    Alcohol/week: 2.0 - 3.0 standard drinks    Types: 2 - 3 Standard drinks or equivalent per week  . Drug use: Not Currently  . Sexual activity: Yes    Partners: Male  Other Topics Concern  . Not on file  Social History Narrative  . Not on file   Social Determinants of Health   Financial Resource Strain:   . Difficulty of Paying Living Expenses: Not on file  Food Insecurity:   . Worried About Programme researcher, broadcasting/film/video in the Last Year: Not on file  . Ran Out of Food in the Last Year: Not on file  Transportation Needs:   . Lack of Transportation (Medical): Not on file  . Lack of Transportation (Non-Medical): Not on file  Physical Activity:   . Days of Exercise per Week: Not on file  . Minutes of Exercise per Session: Not on file  Stress:   . Feeling of Stress : Not on file  Social Connections:   . Frequency of Communication with Friends and Family: Not on file  . Frequency of Social Gatherings with Friends and Family: Not on file  . Attends Religious Services: Not on file  . Active Member of Clubs or Organizations: Not on file  . Attends Banker Meetings: Not on file  . Marital Status: Not on file  Intimate Partner Violence:   . Fear of Current or Ex-Partner: Not on file  . Emotionally Abused: Not on file  . Physically Abused: Not on file  . Sexually Abused: Not on file    Past Medical History,  Surgical history, Social history, and Family history were reviewed and updated as appropriate.   Please see review of systems for further details on the patient's review from today.   Objective:   Physical Exam:  There were no vitals taken for this visit.  Physical Exam Constitutional:      General: She is not in acute distress. Musculoskeletal:        General: No deformity.  Neurological:     Mental Status: She is alert and oriented to person, place, and time.     Coordination: Coordination normal.  Psychiatric:        Attention and Perception: Attention and perception normal. She does not perceive auditory or visual hallucinations.        Mood and Affect: Mood is anxious and depressed. Affect is labile and tearful. Affect  is not blunt, angry or inappropriate.        Speech: Speech normal.        Behavior: Behavior normal.        Thought Content: Thought content normal. Thought content is not paranoid or delusional. Thought content does not include homicidal or suicidal ideation. Thought content does not include homicidal or suicidal plan.        Cognition and Memory: Cognition and memory normal.        Judgment: Judgment normal.     Comments: Insight intact Dysphoric mood     Lab Review:     Component Value Date/Time   NA 133 (L) 03/26/2019 1733   K 3.8 03/26/2019 1733   CL 104 03/26/2019 1733   CO2 17 (L) 03/26/2019 1733   GLUCOSE 85 03/26/2019 1733   BUN 16 03/26/2019 1733   CREATININE 0.70 03/26/2019 1733   CREATININE 0.65 08/09/2016 1601   CALCIUM 9.0 03/26/2019 1733   PROT 6.3 (L) 03/26/2019 1733   ALBUMIN 3.3 (L) 03/26/2019 1733   AST 23 03/26/2019 1733   ALT 27 03/26/2019 1733   ALKPHOS 87 03/26/2019 1733   BILITOT 0.8 03/26/2019 1733   GFRNONAA >60 03/26/2019 1733   GFRAA >60 03/26/2019 1733       Component Value Date/Time   WBC 10.7 (H) 03/27/2019 0444   RBC 3.40 (L) 03/27/2019 0444   HGB 10.0 (L) 03/27/2019 0444   HCT 30.0 (L) 03/27/2019 0444    PLT 173 03/27/2019 0444   MCV 88.2 03/27/2019 0444   MCH 29.4 03/27/2019 0444   MCHC 33.3 03/27/2019 0444   RDW 12.7 03/27/2019 0444   LYMPHSABS 1.5 01/13/2010 1209   MONOABS 0.8 01/13/2010 1209   EOSABS 0.0 01/13/2010 1209   BASOSABS 0.0 01/13/2010 1209    No results found for: POCLITH, LITHIUM   No results found for: PHENYTOIN, PHENOBARB, VALPROATE, CBMZ   .res Assessment: Plan:   Pt seen for 45 minutes and time spent counseling pt regarding results of pharmacogenetic testing, MTHFR mutation, s/s of depression, PTSD, and possible treatment options. Counseled pt re: potential benefits, risks ,and side effects of Deplin for augmentation of depression and MTHFR mutation. Counseled patient regarding potential benefits, risks, and side effects of Lamictal to include potential risk of Stevens-Johnson syndrome. Advised patient to stop taking Lamictal and contact office immediately if rash develops and to seek urgent medical attention if rash is severe and/or spreading quickly. Discussed starting trial of Deplin 15 mg po qd samples for 4 weeks to determine response. Discussed that Deplin is less likely to cause adverse effects compared to other medications since pt reports apprehension about starting medication after adverse effects with Zoloft. Will consider trial of Lamictal in the future.  Recommend trauma therapy.  Pt to follow-up in 4 weeks or sooner if clinically indicated.  Patient advised to contact office with any questions, adverse effects, or acute worsening in signs and symptoms.  Kaitlyn Costa was seen today for depression, anxiety and other.  Diagnoses and all orders for this visit:  Episodic mood disorder (HCC) -     L-Methylfolate-Algae (DEPLIN 15) 15-90.314 MG CAPS; Take 15 mg by mouth daily.  Heterozygous MTHFR mutation C677T -     L-Methylfolate-Algae (DEPLIN 15) 15-90.314 MG CAPS; Take 15 mg by mouth daily.  PTSD (post-traumatic stress disorder)     Please see After  Visit Summary for patient specific instructions.  No future appointments.  No orders of the defined types were placed in this  encounter.   -------------------------------

## 2020-09-17 ENCOUNTER — Telehealth (INDEPENDENT_AMBULATORY_CARE_PROVIDER_SITE_OTHER): Payer: 59 | Admitting: Psychiatry

## 2020-09-17 ENCOUNTER — Telehealth: Payer: Self-pay | Admitting: Psychiatry

## 2020-09-17 DIAGNOSIS — Z1589 Genetic susceptibility to other disease: Secondary | ICD-10-CM | POA: Diagnosis not present

## 2020-09-17 DIAGNOSIS — F39 Unspecified mood [affective] disorder: Secondary | ICD-10-CM | POA: Diagnosis not present

## 2020-09-17 MED ORDER — DEPLIN 15 15-90.314 MG PO CAPS: 15.0000 mg | ORAL_CAPSULE | Freq: Every day | ORAL | 2 refills | Status: AC

## 2020-09-17 NOTE — Progress Notes (Signed)
Kaitlyn Costa [id number] September 03, 1986 34 y.o.  Virtual Visit via Video Note  I connected with pt @ on 09/17/20 at 10:30 AM EST by a video enabled telemedicine application and verified that I am speaking with the correct person using two identifiers.   I discussed the limitations of evaluation and management by telemedicine and the availability of in person appointments. The patient expressed understanding and agreed to proceed.  I discussed the assessment and treatment plan with the patient. The patient was provided an opportunity to ask questions and all were answered. The patient agreed with the plan and demonstrated an understanding of the instructions.   The patient was advised to call back or seek an in-person evaluation if the symptoms worsen or if the condition fails to improve as anticipated.  I provided 30 minutes of non-face-to-face time during this encounter.  The patient was located at home.  The provider was located at [facility].   Corie Chiquito, PMHNP   Subjective:   Patient ID:  Kaitlyn Costa is a 34 y.o. (DOB Nov 24, 1985) female.  Chief Complaint:  Chief Complaint  Patient presents with  . Follow-up    Anxiety, mood disturbance, and insomnia    HPI Kaitlyn Costa presents for follow-up of anxiety, mood disturbance, and insomnia. She reports "I'm doing much better... a 75% increase overall." She reports that her mother and her husband have noticed "a significant difference." She reports that she notices some improvement and questions if "I still need a mood stabilizer." She reports thatshe has been doing well until the last 2 weeks. She reports that she has had only one panic attack and it was much shorter in duration and lasted only about 5 minutes. She reports that episodes of "come-apart, tantrums" have decreased in frequency, intensity, and duration. She reports that she had a "tantrum" the other day where there were multiple demands and events that were  out of her control. She reports that she does not feel as "tense... I can actually relax some." She reports that her husband has commented that she has not been upset or offended by things that would have normally upset her. Family has commented that she seems happier and more relaxed. Husband has commented that she has had more positive and affectionate interactions with their baby.   "I generally feel happier." She reports that "doom and gloom is not there" and now has some periods of sadness that is not associated with hopelessness. She reports that she has been eating more and gaining weight (11 lbs in 2 weeks). She reports that she has had some days where she has difficulty feeling full and satiated. She reports that she tends to be an emotional and social eater. She reports that energy is somewhat better and feels that she does not need caffeine as much. She reports concentration is somewhat improved. Denies SI.   "I am actually sleeping... I'm actually able to turn my brain down or off." Goes to bed between 8-9 am and awakens at 5:30 am. She reports that she has had some nights where she has been able to sleep through the night and other times is awakening with her baby.   She reports that her mother has had Serotonin syndrome with several anti-depressants, to include Cymbalta. She reports that her mother is currently on Lamictal and tolerating this well.   Past Psychiatric Medication Trials: Reports h/o med sensitivity and serotonin syndrome Zoloft- Serotonin syndrome. Started having mood lability. Had increased sweats, heart racing,  elevated mood, increased goal-directed activity and then had severe anxiety and panic. Wellbutrin XL- insomnia, night sweats, night terrors, felt sped up   Review of Systems:  Review of Systems  Musculoskeletal: Positive for back pain. Negative for gait problem.  Neurological: Positive for headaches.  Psychiatric/Behavioral:       Please refer to HPI     Medications: I have reviewed the patient's current medications.  Current Outpatient Medications  Medication Sig Dispense Refill  . levonorgestrel (MIRENA) 20 MCG/24HR IUD 1 each by Intrauterine route once.    Marland Kitchen L-Methylfolate-Algae (DEPLIN 15) 15-90.314 MG CAPS Take 15 mg by mouth daily. 90 capsule 2  . Multiple Vitamins-Minerals (WOMENS MULTIVITAMIN PO) Take by mouth. (Patient not taking: No sig reported)     No current facility-administered medications for this visit.    Medication Side Effects: Other: Possible headaches and wt gain  Allergies:  Allergies  Allergen Reactions  . Augmentin [Amoxicillin-Pot Clavulanate] Shortness Of Breath and Rash  . Avelox [Moxifloxacin Hcl In Nacl] Itching and Swelling  . Bupropion   . Sertraline     Serotonin syndrome    Past Medical History:  Diagnosis Date  . Complication of anesthesia    locals dont numb very well  . Diabetes mellitus without complication (HCC)   . Dysmenorrhea   . Ectopic pregnancy    treated with medication  . Frank breech presentation 03/26/2019  . GERD (gastroesophageal reflux disease)   . Gestational diabetes   . Headache   . Herniated disc, cervical   . History of diabetes mellitus   . History of genital warts   . HPV in female   . PCOS (polycystic ovarian syndrome)   . Thyroid disease     Family History  Problem Relation Age of Onset  . Diabetes Maternal Grandmother   . Diabetes Maternal Grandfather   . Anxiety disorder Maternal Grandfather   . Panic disorder Maternal Grandfather   . Depression Mother   . Schizophrenia Father   . Alcohol abuse Father     Social History   Socioeconomic History  . Marital status: Married    Spouse name: Not on file  . Number of children: Not on file  . Years of education: Not on file  . Highest education level: Not on file  Occupational History  . Not on file  Tobacco Use  . Smoking status: Never Smoker  . Smokeless tobacco: Never Used  Vaping Use  .  Vaping Use: Never used  Substance and Sexual Activity  . Alcohol use: Yes    Alcohol/week: 2.0 - 3.0 standard drinks    Types: 2 - 3 Standard drinks or equivalent per week  . Drug use: Not Currently  . Sexual activity: Yes    Partners: Male  Other Topics Concern  . Not on file  Social History Narrative  . Not on file   Social Determinants of Health   Financial Resource Strain: Not on file  Food Insecurity: Not on file  Transportation Needs: Not on file  Physical Activity: Not on file  Stress: Not on file  Social Connections: Not on file  Intimate Partner Violence: Not on file    Past Medical History, Surgical history, Social history, and Family history were reviewed and updated as appropriate.   Please see review of systems for further details on the patient's review from today.   Objective:   Physical Exam:  There were no vitals taken for this visit.  Physical Exam Neurological:  Mental Status: She is alert and oriented to person, place, and time.     Cranial Nerves: No dysarthria.  Psychiatric:        Attention and Perception: Attention and perception normal.        Mood and Affect: Mood normal.        Speech: Speech normal.        Behavior: Behavior is cooperative.        Thought Content: Thought content normal. Thought content is not paranoid or delusional. Thought content does not include homicidal or suicidal ideation. Thought content does not include homicidal or suicidal plan.        Cognition and Memory: Cognition and memory normal.        Judgment: Judgment normal.     Comments: Insight intact     Lab Review:     Component Value Date/Time   NA 133 (L) 03/26/2019 1733   K 3.8 03/26/2019 1733   CL 104 03/26/2019 1733   CO2 17 (L) 03/26/2019 1733   GLUCOSE 85 03/26/2019 1733   BUN 16 03/26/2019 1733   CREATININE 0.70 03/26/2019 1733   CREATININE 0.65 08/09/2016 1601   CALCIUM 9.0 03/26/2019 1733   PROT 6.3 (L) 03/26/2019 1733   ALBUMIN 3.3 (L)  03/26/2019 1733   AST 23 03/26/2019 1733   ALT 27 03/26/2019 1733   ALKPHOS 87 03/26/2019 1733   BILITOT 0.8 03/26/2019 1733   GFRNONAA >60 03/26/2019 1733   GFRAA >60 03/26/2019 1733       Component Value Date/Time   WBC 10.7 (H) 03/27/2019 0444   RBC 3.40 (L) 03/27/2019 0444   HGB 10.0 (L) 03/27/2019 0444   HCT 30.0 (L) 03/27/2019 0444   PLT 173 03/27/2019 0444   MCV 88.2 03/27/2019 0444   MCH 29.4 03/27/2019 0444   MCHC 33.3 03/27/2019 0444   RDW 12.7 03/27/2019 0444   LYMPHSABS 1.5 01/13/2010 1209   MONOABS 0.8 01/13/2010 1209   EOSABS 0.0 01/13/2010 1209   BASOSABS 0.0 01/13/2010 1209    No results found for: POCLITH, LITHIUM   No results found for: PHENYTOIN, PHENOBARB, VALPROATE, CBMZ   .res Assessment: Plan:   Pt reports significant improvement in mood s/s, anxiety, and insomnia since start of Deplin 15 mg po qd. She reports that she would like to continue Deplin 15 mg po qd to continue to evaluate response since she reports that mood and anxiety s/s may further improve after the holidays. She reports that she may be interested in starting Lamictal in the future as discussed during last visit, particularly since she has recently learned that her mother, whom she has had similar responses to medications to, has responded well to Lamictal and been able to tolerate it without difficulty.  Pt to follow-up in 1-2 months or sooner if clinically indicated.  Patient advised to contact office with any questions, adverse effects, or acute worsening in signs and symptoms.   Uldine was seen today for follow-up.  Diagnoses and all orders for this visit:  Episodic mood disorder (HCC) -     L-Methylfolate-Algae (DEPLIN 15) 15-90.314 MG CAPS; Take 15 mg by mouth daily.  Heterozygous MTHFR mutation C677T -     L-Methylfolate-Algae (DEPLIN 15) 15-90.314 MG CAPS; Take 15 mg by mouth daily.     Please see After Visit Summary for patient specific instructions.  Future  Appointments  Date Time Provider Department Center  11/11/2020  2:30 PM Corie Chiquito, PMHNP [facility] None    No  orders of the defined types were placed in this encounter.     -------------------------------

## 2020-09-17 NOTE — Telephone Encounter (Signed)
[name], kindall are scheduled for a virtual visit with your provider today.    Just as we do with appointments in the office, we must obtain your consent to participate.  Your consent will be active for this visit and any virtual visit you may have with one of our providers in the next 365 days.    If you have a MyChart account, I can also send a copy of this consent to you electronically.  All virtual visits are billed to your insurance company just like a traditional visit in the office.  As this is a virtual visit, video technology does not allow for your provider to perform a traditional examination.  This may limit your provider's ability to fully assess your condition.  If your provider identifies any concerns that need to be evaluated in person or the need to arrange testing such as labs, EKG, etc, we will make arrangements to do so.    Although advances in technology are sophisticated, we cannot ensure that it will always work on either your end or our end.  If the connection with a video visit is poor, we may have to switch to a telephone visit.  With either a video or telephone visit, we are not always able to ensure that we have a secure connection.   I need to obtain your verbal consent now.   Are you willing to proceed with your visit today?   Kaitlyn Costa has provided verbal consent on 09/17/2020 for a virtual visit (video or telephone).   Corie Chiquito, PMHNP 09/17/2020  10:33 AM

## 2020-09-18 ENCOUNTER — Telehealth: Payer: Self-pay | Admitting: Psychiatry

## 2020-09-18 NOTE — Telephone Encounter (Signed)
Kaitlyn Costa just called and is following up on the conversation she had with you by video yesterday about her Deplin.  She was asking if you had contacted the company that could possibly send her the brand name of Deplin as it would be cheaper. Her phone number is [phone].

## 2020-09-18 NOTE — Telephone Encounter (Signed)
I tried to leave a message but her voice mail box is full.

## 2020-09-20 ENCOUNTER — Encounter: Payer: Self-pay | Admitting: Psychiatry

## 2020-11-11 ENCOUNTER — Other Ambulatory Visit: Payer: Self-pay

## 2020-11-11 ENCOUNTER — Telehealth: Payer: 59 | Admitting: Psychiatry

## 2020-11-11 NOTE — Progress Notes (Signed)
TAINA LANDRY [id number] 1986/02/23 35 y.o.     Pt not seen. She requested rescheduling apt since she was sick with COVID.       Subjective:   Patient ID:  KAYDIE PETSCH is a 35 y.o. (DOB 12-20-1985) female.  Chief Complaint: No chief complaint on file.   HPI Leisa LINDSIE SIMAR presents for follow-up of  (Pt not seen) Review of Systems Objective:   Physical Exam:  There were no vitals taken for this visit.  Physical Exam

## 2020-11-13 ENCOUNTER — Other Ambulatory Visit: Payer: Self-pay | Admitting: Otolaryngology

## 2020-11-13 DIAGNOSIS — R221 Localized swelling, mass and lump, neck: Secondary | ICD-10-CM

## 2020-12-05 ENCOUNTER — Ambulatory Visit
Admission: RE | Admit: 2020-12-05 | Discharge: 2020-12-05 | Disposition: A | Discharge status: Home / Self Care | Payer: 59 | Source: Ambulatory Visit | Attending: Otolaryngology | Admitting: Otolaryngology

## 2020-12-05 DIAGNOSIS — R221 Localized swelling, mass and lump, neck: Secondary | ICD-10-CM

## 2020-12-05 MED ORDER — IOPAMIDOL (ISOVUE-300) INJECTION 61%
75.0000 mL | Freq: Once | INTRAVENOUS | Status: AC | PRN
  Administered 2020-12-05: 75 mL via INTRAVENOUS

## 2020-12-09 ENCOUNTER — Encounter: Payer: Self-pay | Admitting: Psychiatry

## 2020-12-09 ENCOUNTER — Telehealth (INDEPENDENT_AMBULATORY_CARE_PROVIDER_SITE_OTHER): Payer: 59 | Admitting: Psychiatry

## 2020-12-09 VITALS — Wt 197.0 lb

## 2020-12-09 DIAGNOSIS — F4310 Post-traumatic stress disorder, unspecified: Secondary | ICD-10-CM

## 2020-12-09 DIAGNOSIS — Z1589 Genetic susceptibility to other disease: Secondary | ICD-10-CM

## 2020-12-09 DIAGNOSIS — F39 Unspecified mood [affective] disorder: Secondary | ICD-10-CM | POA: Diagnosis not present

## 2020-12-09 NOTE — Progress Notes (Signed)
Kaitlyn Costa [id number] 1985-11-22 35 y.o.  Virtual Visit via Telephone Note  I connected with pt on 12/09/20 at 11:00 AM EST by telephone and verified that I am speaking with the correct person using two identifiers.   I discussed the limitations, risks, security and privacy concerns of performing an evaluation and management service by telephone and the availability of in person appointments. I also discussed with the patient that there may be a patient responsible charge related to this service. The patient expressed understanding and agreed to proceed.   I discussed the assessment and treatment plan with the patient. The patient was provided an opportunity to ask questions and all were answered. The patient agreed with the plan and demonstrated an understanding of the instructions.   The patient was advised to call back or seek an in-person evaluation if the symptoms worsen or if the condition fails to improve as anticipated.  I provided 30 minutes of non-face-to-face time during this encounter.  The patient was located at home.  The provider was located at [facility].   Corie Chiquito, PMHNP   Subjective:   Patient ID:  Kaitlyn Costa is a 35 y.o. (DOB [birth year]) female.  Chief Complaint:  Chief Complaint  Patient presents with  . Anxiety  . Other    Mood disturbance    HPI Kaitlyn Costa presents for follow-up of anxiety, mood disturbance, and insomnia. She reports that she feels that things have been "kind of a roller coaster." She reports that when she initially started Deplin she noticed improved mood, focus, and cognition. She reports that she is no longer experiencing these benefits and questions if she has had a placebo response. She reports that she has about an equal number of "good days and bad days." She reports that she has some occasional panic attacks and reports that panic seems to occur in response to trauma triggers. She reports heart palpitations,  numb and tingly in extremities, and some nausea with severe anxiety. She reports that she occasionally feels as if she is shaking but is not visibly shaking. She reports that panic attacks seemed to start in May of last year after serotonin syndrome. She reports that she is not sure how often she is having panic attack and they seem sporadic. She reports overall increased anxiety and worry with world events.   She reports that she has also has mood lability and her mother and husband have noticed she is more easily agitated and angered and does not seem to be as happy. She describes mood as "detached" and is not sure if she is sad. Sleep has "regressed" and that this could be due to different factors. She reports that her nightmares have subsided. Estimates sleeping about 4 hours a night. Appetite has been "ravenous." She reports 20 lbs wt gain since late December until present. Had lost 46 lbs prior to wt gain. Energy "comes and goes" and has been using energy drinks to feel like she can function. She reports that she sometimes experiences increased or excessive energy after a few sips of energy drinks. She reports that her concentration has been poor and has had difficulty following recipes. Denies SI.   She reports that she has noticed a seasonal component to depressive s/s and this seems to be worsening over time.   Reports that she continues to see Therapist, sports.   Past Psychiatric Medication Trials: Reports h/o med sensitivity and serotonin syndrome Zoloft- Serotonin syndrome. Started having mood lability.  Had increased sweats, heart racing, elevated mood, increased goal-directed activity and then had severe anxiety and panic. Wellbutrin XL- insomnia, night sweats, night terrors, felt sped up  Review of Systems:  Review of Systems  Musculoskeletal: Negative for gait problem.  Neurological: Negative for tremors.  Psychiatric/Behavioral:       Please refer to HPI     Medications: I have reviewed the patient's current medications.  Current Outpatient Medications  Medication Sig Dispense Refill  . L-Methylfolate-Algae (DEPLIN 15) 15-90.314 MG CAPS Take 15 mg by mouth daily. 90 capsule 2  . levonorgestrel (MIRENA) 20 MCG/24HR IUD 1 each by Intrauterine route once.     No current facility-administered medications for this visit.    Medication Side Effects: Other: ? Wt gain?  Allergies:  Allergies  Allergen Reactions  . Augmentin [Amoxicillin-Pot Clavulanate] Shortness Of Breath and Rash  . Avelox [Moxifloxacin Hcl In Nacl] Itching and Swelling  . Bupropion   . Sertraline     Serotonin syndrome    Past Medical History:  Diagnosis Date  . Complication of anesthesia    locals dont numb very well  . Diabetes mellitus without complication (HCC)   . Dysmenorrhea   . Ectopic pregnancy    treated with medication  . Frank breech presentation 03/26/2019  . GERD (gastroesophageal reflux disease)   . Gestational diabetes   . Headache   . Herniated disc, cervical   . History of diabetes mellitus   . History of genital warts   . HPV in female   . PCOS (polycystic ovarian syndrome)   . Thyroid disease     Family History  Problem Relation Age of Onset  . Diabetes Maternal Grandmother   . Diabetes Maternal Grandfather   . Anxiety disorder Maternal Grandfather   . Panic disorder Maternal Grandfather   . Depression Mother   . Schizophrenia Father   . Alcohol abuse Father     Social History   Socioeconomic History  . Marital status: Married    Spouse name: Not on file  . Number of children: Not on file  . Years of education: Not on file  . Highest education level: Not on file  Occupational History  . Not on file  Tobacco Use  . Smoking status: Never Smoker  . Smokeless tobacco: Never Used  Vaping Use  . Vaping Use: Never used  Substance and Sexual Activity  . Alcohol use: Yes    Alcohol/week: 2.0 - 3.0 standard drinks    Types:  2 - 3 Standard drinks or equivalent per week  . Drug use: Not Currently  . Sexual activity: Yes    Partners: Male  Other Topics Concern  . Not on file  Social History Narrative  . Not on file   Social Determinants of Health   Financial Resource Strain: Not on file  Food Insecurity: Not on file  Transportation Needs: Not on file  Physical Activity: Not on file  Stress: Not on file  Social Connections: Not on file  Intimate Partner Violence: Not on file    Past Medical History, Surgical history, Social history, and Family history were reviewed and updated as appropriate.   Please see review of systems for further details on the patient's review from today.   Objective:   Physical Exam:  Wt 197 lb (89.4 kg)   BMI 32.78 kg/m   Physical Exam Constitutional:      General: She is not in acute distress. Musculoskeletal:  General: No deformity.  Neurological:     Mental Status: She is alert and oriented to person, place, and time.     Coordination: Coordination normal.  Psychiatric:        Attention and Perception: Attention and perception normal. She does not perceive auditory or visual hallucinations.        Mood and Affect: Mood is anxious. Affect is not labile, blunt, angry or inappropriate.        Behavior: Behavior normal.        Thought Content: Thought content normal. Thought content is not paranoid or delusional. Thought content does not include homicidal or suicidal ideation. Thought content does not include homicidal or suicidal plan.        Cognition and Memory: Cognition and memory normal.        Judgment: Judgment normal.     Comments: Insight intact Dysphoric mood     Lab Review:     Component Value Date/Time   NA 133 (L) 03/26/2019 1733   K 3.8 03/26/2019 1733   CL 104 03/26/2019 1733   CO2 17 (L) 03/26/2019 1733   GLUCOSE 85 03/26/2019 1733   BUN 16 03/26/2019 1733   CREATININE 0.70 03/26/2019 1733   CREATININE 0.65 08/09/2016 1601   CALCIUM  9.0 03/26/2019 1733   PROT 6.3 (L) 03/26/2019 1733   ALBUMIN 3.3 (L) 03/26/2019 1733   AST 23 03/26/2019 1733   ALT 27 03/26/2019 1733   ALKPHOS 87 03/26/2019 1733   BILITOT 0.8 03/26/2019 1733   GFRNONAA >60 03/26/2019 1733   GFRAA >60 03/26/2019 1733       Component Value Date/Time   WBC 10.7 (H) 03/27/2019 0444   RBC 3.40 (L) 03/27/2019 0444   HGB 10.0 (L) 03/27/2019 0444   HCT 30.0 (L) 03/27/2019 0444   PLT 173 03/27/2019 0444   MCV 88.2 03/27/2019 0444   MCH 29.4 03/27/2019 0444   MCHC 33.3 03/27/2019 0444   RDW 12.7 03/27/2019 0444   LYMPHSABS 1.5 01/13/2010 1209   MONOABS 0.8 01/13/2010 1209   EOSABS 0.0 01/13/2010 1209   BASOSABS 0.0 01/13/2010 1209    No results found for: POCLITH, LITHIUM   No results found for: PHENYTOIN, PHENOBARB, VALPROATE, CBMZ   .res Assessment: Plan:   Pt seen for 30 minutes and time spent discussing pt's concerns about taking medication and reviewing potential benefits, risks, and side effects of Lamictal. Discussed that Lamictal targets glutamate and not serotonin, since serotonin syndrome syndrome is one of her primary concerns. Discussed that having a parent tolerating and responding well to Lamictal indicates that it may be effective and well tolerated for her as well.  She would like to consider Lamictal and plans to contact office if she decides she would like to start Lamictal.    Kaitlyn Costa was seen today for anxiety and other.  Diagnoses and all orders for this visit:  Episodic mood disorder (HCC)  PTSD (post-traumatic stress disorder)  Heterozygous MTHFR mutation [id number]    Please see After Visit Summary for patient specific instructions.  No future appointments.  No orders of the defined types were placed in this encounter.     -------------------------------

## 2021-02-19 ENCOUNTER — Emergency Department (HOSPITAL_COMMUNITY): Payer: 59

## 2021-02-19 ENCOUNTER — Encounter (HOSPITAL_COMMUNITY): Payer: Self-pay | Admitting: *Deleted

## 2021-02-19 ENCOUNTER — Other Ambulatory Visit: Payer: Self-pay

## 2021-02-19 ENCOUNTER — Emergency Department (HOSPITAL_COMMUNITY)
Admission: EM | Admit: 2021-02-19 | Discharge: 2021-02-19 | Disposition: A | Discharge status: Home / Self Care | Payer: 59 | Attending: Emergency Medicine | Admitting: Emergency Medicine

## 2021-02-19 DIAGNOSIS — Z79899 Other long term (current) drug therapy: Secondary | ICD-10-CM | POA: Insufficient documentation

## 2021-02-19 DIAGNOSIS — W010XXA Fall on same level from slipping, tripping and stumbling without subsequent striking against object, initial encounter: Secondary | ICD-10-CM | POA: Diagnosis not present

## 2021-02-19 DIAGNOSIS — S4991XA Unspecified injury of right shoulder and upper arm, initial encounter: Secondary | ICD-10-CM | POA: Diagnosis present

## 2021-02-19 DIAGNOSIS — S43014A Anterior dislocation of right humerus, initial encounter: Secondary | ICD-10-CM | POA: Diagnosis not present

## 2021-02-19 DIAGNOSIS — E119 Type 2 diabetes mellitus without complications: Secondary | ICD-10-CM | POA: Insufficient documentation

## 2021-02-19 DIAGNOSIS — S43004A Unspecified dislocation of right shoulder joint, initial encounter: Secondary | ICD-10-CM

## 2021-02-19 DIAGNOSIS — Y9389 Activity, other specified: Secondary | ICD-10-CM | POA: Diagnosis not present

## 2021-02-19 DIAGNOSIS — R002 Palpitations: Secondary | ICD-10-CM | POA: Diagnosis not present

## 2021-02-19 DIAGNOSIS — E039 Hypothyroidism, unspecified: Secondary | ICD-10-CM | POA: Diagnosis not present

## 2021-02-19 DIAGNOSIS — R5383 Other fatigue: Secondary | ICD-10-CM | POA: Insufficient documentation

## 2021-02-19 DIAGNOSIS — F419 Anxiety disorder, unspecified: Secondary | ICD-10-CM | POA: Insufficient documentation

## 2021-02-19 MED ORDER — FENTANYL CITRATE (PF) 100 MCG/2ML IJ SOLN
50.0000 ug | Freq: Once | INTRAMUSCULAR | Status: AC
  Administered 2021-02-19: 50 ug via INTRAVENOUS
  Filled 2021-02-19: qty 2

## 2021-02-19 MED ORDER — OXYCODONE-ACETAMINOPHEN 5-325 MG PO TABS
1.0000 | ORAL_TABLET | ORAL | Status: DC | PRN
  Administered 2021-02-19: 1 via ORAL
  Filled 2021-02-19: qty 1

## 2021-02-19 MED ORDER — IBUPROFEN 400 MG PO TABS
400.0000 mg | ORAL_TABLET | Freq: Once | ORAL | Status: AC | PRN
  Administered 2021-02-19: 400 mg via ORAL
  Filled 2021-02-19: qty 1

## 2021-02-19 MED ORDER — ONDANSETRON HCL 4 MG/2ML IJ SOLN
4.0000 mg | Freq: Once | INTRAMUSCULAR | Status: AC
  Administered 2021-02-19: 4 mg via INTRAVENOUS
  Filled 2021-02-19: qty 2

## 2021-02-19 MED ORDER — LIDOCAINE HCL (PF) 1 % IJ SOLN
30.0000 mL | Freq: Once | INTRAMUSCULAR | Status: DC
  Filled 2021-02-19: qty 30

## 2021-02-19 NOTE — ED Provider Notes (Signed)
Emergency Medicine Provider Triage Evaluation Note  Kaitlyn Costa , a 35 y.o. female  was evaluated in triage.  Pt complains of right shoulder pain.  Patient reports dislocation which occurred several hours ago.  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  She has a history of chronic dislocations with surgery by Dr. Romeo Apple in [place].  She has not had any dislocations in the last 20 years.  She does have some mild numbness to the right hand, but she also has some chronic nerve damage from her prior dislocations.  She reports need for conscious sedation for reductions in the past.  She also reports a history of serotonin syndrome and is been having occasional palpitations has not been evaluated by cardiology yet.  She denies hitting her head or losing consciousness.  Review of Systems  Positive: As above Negative: As above  Physical Exam  BP (!) 120/93 (BP Location: Right Arm)   Pulse 88   Temp 98.6 F (37 C) (Oral)   Resp 15   SpO2 98%  Gen:   Awake, no distress   Resp:  Normal effort  MSK:   Moves extremities without difficulty  Other:  2+ radial pulse on the right, fingers are cold but cap refill intact.  Able to move all 5 fingers or digits.  Right shoulder with small step-off.  Medical Decision Making  Medically screening exam initiated at 6:00 PM.  Appropriate orders placed.  Zena KLEE KOLEK was informed that the remainder of the evaluation will be completed by another provider, this initial triage assessment does not replace that evaluation, and the importance of remaining in the ED until their evaluation is complete.     Mare Ferrari, PA-C 02/19/21 1802    Koleen Distance, MD 02/19/21 2300

## 2021-02-19 NOTE — ED Triage Notes (Signed)
Pt is here for right shoulder dislocation.  Has had her shoulder dislocated several times and states that she requires conscious sedation for reduction.

## 2021-02-19 NOTE — ED Provider Notes (Signed)
[facility] EMERGENCY DEPARTMENT Provider Note   CSN: [id number] Arrival date & time: 02/19/21  1623     History Chief Complaint  Patient presents with  . Dislocation    Kaitlyn Costa is a 35 y.o. female.  With a past medical history of repetitive dislocation of the right shoulder with previous repair with last dislocation 10 years ago who presents with right shoulder dislocation.  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  She was in a very tight space and climbed under the table to help with an IV but slipped, fell backward onto her arm and felt her right shoulder dislocate.  She has had pretty severe pain since that time.  She did have some numbness and tingling in her hand which she states she always gets and she dislocates her right shoulder.  She states she is also been having intermittent episodes of racing heart, thready pulse feeling of anxiety and fatigue [place].  This has been going on and off for several years after she developed serotonin syndrome.  She has no current symptoms at this time.  HPI     Past Medical History:  Diagnosis Date  . Complication of anesthesia    locals dont numb very well  . Diabetes mellitus without complication (HCC)   . Dysmenorrhea   . Ectopic pregnancy    treated with medication  . Frank breech presentation 03/26/2019  . GERD (gastroesophageal reflux disease)   . Gestational diabetes   . Headache   . Herniated disc, cervical   . History of diabetes mellitus   . History of genital warts   . HPV in female   . PCOS (polycystic ovarian syndrome)   . Thyroid disease     Patient Active Problem List   Diagnosis Date Noted  . Delivery by emergency cesarean 03/27/2019  . Breech presentation 03/27/2019  . Gestational hypertension 03/26/2019  . Frank breech presentation 03/26/2019  . Postpartum care following cesarean delivery (6/22) 03/26/2019  . IMPINGEMENT  SYNDROME 04/09/2008  . ACROMIOCLAVICULAR SPRAIN AND STRAIN 04/09/2008  . SHOULDER PAIN 03/18/2008  . INFECTION, VIRAL NOS 12/09/2006  . DIABETES MELLITUS, TYPE II, UNCONTROLLED 12/09/2006  . HYPOTHYROIDISM 10/18/2006  . DIABETES MELLITUS, TYPE II 10/18/2006  . ANXIETY 10/18/2006  . MIGRAINE HEADACHE 10/18/2006  . GERD 10/18/2006  . IBS 10/18/2006    Past Surgical History:  Procedure Laterality Date  . CESAREAN SECTION N/A 03/26/2019   Procedure: Primary CESAREAN SECTION;  Surgeon: Shea Evans, MD;  Location: [facility] LD ORS;  Service: Obstetrics;  Laterality: N/A;  EDD: 04/10/19 Allergy: Augmentin, Novolin, Avelox  . CRANIOTOMY    . SHOULDER SURGERY    . skull surgery    . TONSILLECTOMY       OB History    Gravida  3   Para  1   Term  1   Preterm      AB  2   Living  1     SAB  1   IAB      Ectopic  1   Multiple  0   Live Births  1           Family History  Problem Relation Age of Onset  . Diabetes Maternal Grandmother   . Diabetes Maternal Grandfather   . Anxiety disorder Maternal Grandfather   . Panic disorder Maternal Grandfather   . Depression Mother   . Schizophrenia Father   .  Alcohol abuse Father     Social History   Tobacco Use  . Smoking status: Never Smoker  . Smokeless tobacco: Never Used  Vaping Use  . Vaping Use: Never used  Substance Use Topics  . Alcohol use: Yes    Alcohol/week: 2.0 - 3.0 standard drinks    Types: 2 - 3 Standard drinks or equivalent per week  . Drug use: Not Currently    Home Medications Prior to Admission medications   Medication Sig Start Date End Date Taking? Authorizing Provider  Cholecalciferol 125 MCG (5000 UT) capsule Take by mouth.    [provider]  L-Methylfolate-Algae (DEPLIN 15) 15-90.314 MG CAPS Take 15 mg by mouth daily. 09/17/20   Corie Chiquito, PMHNP  levonorgestrel (MIRENA) 20 MCG/24HR IUD 1 each by Intrauterine route once.    [provider]    Allergies     Amoxicillin-pot clavulanate, Avelox [moxifloxacin hcl in nacl], Avelox [moxifloxacin], Bupropion, and Sertraline  Review of Systems   Review of Systems Ten systems reviewed and are negative for acute change, except as noted in the HPI.   Physical Exam Updated Vital Signs BP 112/82   Pulse 70   Temp 98.6 F (37 C) (Oral)   Resp 13   SpO2 97%   Physical Exam Vitals and nursing note reviewed.  Constitutional:      General: She is not in acute distress.    Appearance: She is well-developed. She is not diaphoretic.  HENT:     Head: Normocephalic and atraumatic.  Eyes:     General: No scleral icterus.    Conjunctiva/sclera: Conjunctivae normal.  Cardiovascular:     Rate and Rhythm: Normal rate and regular rhythm.     Heart sounds: Normal heart sounds. No murmur heard. No friction rub. No gallop.   Pulmonary:     Effort: Pulmonary effort is normal. No respiratory distress.     Breath sounds: Normal breath sounds.  Abdominal:     General: Bowel sounds are normal. There is no distension.     Palpations: Abdomen is soft. There is no mass.     Tenderness: There is no abdominal tenderness. There is no guarding.  Musculoskeletal:     Cervical back: Normal range of motion.  Skin:    General: Skin is warm and dry.  Neurological:     Mental Status: She is alert and oriented to person, place, and time.  Psychiatric:        Behavior: Behavior normal.     ED Results / Procedures / Treatments   Labs (all labs ordered are listed, but only abnormal results are displayed) Labs Reviewed - No data to display  EKG EKG Interpretation  Date/Time:  Thursday Feb 19 2021 19:32:12 EDT Ventricular Rate:  82 PR Interval:  163 QRS Duration: 109 QT Interval:  378 QTC Calculation: 442 R Axis:   43 Text Interpretation: Sinus rhythm Low voltage, precordial leads No old tracing to compare Confirmed by Pricilla Loveless [phone]) on 02/19/2021 7:42:01 PM   Radiology DG Shoulder Right  Result  Date: 02/19/2021 CLINICAL DATA:  Status post fall. EXAM: RIGHT SHOULDER - 2+ VIEW COMPARISON:  None. FINDINGS: Anterior dislocation of the right humeral head is seen with respect to the right glenoid. No associated fracture is identified. There is no evidence of arthropathy or other focal bone abnormality. Soft tissues are unremarkable. IMPRESSION: Anterior dislocation of the right shoulder. Electronically Signed   By: Aram Candela M.D.   On: 02/19/2021 19:08  DG Shoulder Right Port  Result Date: 02/19/2021 CLINICAL DATA:  Post reduction views. EXAM: PORTABLE RIGHT SHOULDER COMPARISON:  Feb 19, 2021 (6:53 p.m.) FINDINGS: There is no evidence of fracture or dislocation. There is no evidence of arthropathy or other focal bone abnormality. Soft tissues are unremarkable. IMPRESSION: Interval reduction of the anterior shoulder dislocation seen on the prior study. Electronically Signed   By: Aram Candela M.D.   On: 02/19/2021 20:30    Procedures Reduction of dislocation  Date/Time: 02/19/2021 8:18 PM Performed by: Arthor Captain, PA-C Authorized by: Arthor Captain, PA-C  Consent: Verbal consent obtained. Consent given by: patient Patient understanding: patient states understanding of the procedure being performed Imaging studies: imaging studies available Patient identity confirmed: hospital-assigned identification number Time out: Immediately prior to procedure a "time out" was called to verify the correct patient, procedure, equipment, support staff and site/side marked as required. Preparation: Patient was prepped and draped in the usual sterile fashion. Local anesthesia used: yes Anesthesia: local infiltration  Anesthesia: Local anesthesia used: yes Local Anesthetic: lidocaine 1% without epinephrine Anesthetic total: 10 mL  Sedation: Patient sedated: no  Patient tolerance: patient tolerated the procedure well with no immediate complications      Medications Ordered in  ED Medications  oxyCODONE-acetaminophen (PERCOCET/ROXICET) 5-325 MG per tablet 1 tablet (1 tablet Oral Given 02/19/21 1747)  lidocaine (PF) (XYLOCAINE) 1 % injection 30 mL (30 mLs Infiltration Handoff 02/19/21 1932)  ibuprofen (ADVIL) tablet 400 mg (400 mg Oral Given 02/19/21 1747)  fentaNYL (SUBLIMAZE) injection 50 mcg (50 mcg Intravenous Given 02/19/21 1933)  ondansetron (ZOFRAN) injection 4 mg (4 mg Intravenous Given 02/19/21 1933)    ED Course  I have reviewed the triage vital signs and the nursing notes.  Pertinent labs & imaging results that were available during my care of the patient were reviewed by me and considered in my medical decision making (see chart for details).    MDM Rules/Calculators/A&P                          Patient here with right anterior shoulder dislocation.  I was able to reduce the shoulder with local anesthesia and joint injection along with a small amount of fentanyl.  Patient placed in sling immobilizer.  She is neurovascularly intact status postreduction.  I ordered and reviewed a twelve-lead EKG that shows normal sinus rhythm at a rate of 82.  Patient's pain is improved.  She was otherwise appropriate for discharge at this time.  Given referral back to Dr. Romeo Apple who did her previous repair as well as Dr. Wyline Mood in [place] or even cardiology as she lives close to this area. Final Clinical Impression(s) / ED Diagnoses Final diagnoses:  None    Rx / DC Orders ED Discharge Orders    None       Arthor Captain, PA-C 02/19/21 2048    Pricilla Loveless, MD 02/21/21 [phone]

## 2021-02-19 NOTE — Discharge Instructions (Signed)
Contact a health care provider if: °Your brace or sling gets damaged. °Get help right away if: °Your pain gets worse rather than better. °You lose feeling in your arm or hand. °Your arm or hand becomes white and cold. °

## 2021-02-26 ENCOUNTER — Ambulatory Visit: Payer: 59 | Admitting: Orthopedic Surgery

## 2021-03-04 ENCOUNTER — Telehealth: Payer: Self-pay | Admitting: Orthopedic Surgery

## 2021-03-04 NOTE — Telephone Encounter (Signed)
Patient can be scheduled with me.  No rush, but next available is fine.    Thanks [employer]

## 2021-03-04 NOTE — Telephone Encounter (Addendum)
Call received from Eli Phillips, at [employer], ph# [phone], ext [phone], and fax# [phone], called to request appointment for patient's work-related injury, initially seen at [facility] Emergency room, but requests our clinic due to patient's location to office.  Per Xray 02/19/21: IMPRESSION: Anterior dislocation of the right shoulder. Please review and advise* *Note: patient had initially been scheduled for 02/26/21, with Dr Romeo Apple, as she had seen Dr Romeo Apple in past; patient had called back to cancel appointment due to awaiting Workers comp to call to schedule*

## 2021-03-05 NOTE — Telephone Encounter (Signed)
Called back to patient; scheduled accordingly. Patient and worker's comp aware.

## 2021-03-10 ENCOUNTER — Ambulatory Visit: Payer: 59

## 2021-03-10 ENCOUNTER — Encounter: Payer: Self-pay | Admitting: Orthopedic Surgery

## 2021-03-10 ENCOUNTER — Other Ambulatory Visit: Payer: Self-pay

## 2021-03-10 ENCOUNTER — Ambulatory Visit (INDEPENDENT_AMBULATORY_CARE_PROVIDER_SITE_OTHER): Payer: No Typology Code available for payment source | Admitting: Orthopedic Surgery

## 2021-03-10 VITALS — BP 133/89 | HR 75 | Ht 65.0 in | Wt 176.0 lb

## 2021-03-10 DIAGNOSIS — S43004A Unspecified dislocation of right shoulder joint, initial encounter: Secondary | ICD-10-CM | POA: Diagnosis not present

## 2021-03-10 NOTE — Progress Notes (Signed)
New Patient Visit  Assessment: Kaitlyn Costa is a 35 y.o. female with the following: Closed dislocation of right shoulder, initial encounter  Plan: Patient recently dislocated her right shoulder.  She does have remote history of right shoulder dislocation, requiring a open stabilization procedure.  This was done over 20 years ago.  She has done well until recently.  We had an extensive discussion regarding her current presentation.  It is possible, that she can recover from this most recent injury, complete physical therapy and return to her previous level of activity.  However, because she has a history of dislocations requiring surgery, she may remain unstable.  Nonetheless, I would like to obtain an MRI of her right shoulder to assess the soft tissue surrounding her shoulder joint.  Once the MRI is complete, we can discuss the findings at that time.  If she is unable to return to her previous level function with physical therapy, we may have to consider surgical procedure, which would likely require a bony procedure in order to achieve stability.  All questions were answered she is amenable this plan.  The MRI will be completed once we obtain authorization through her Worker's Compensation case.   Follow-up: No follow-ups on file.  Subjective:  Chief Complaint  Patient presents with  . Shoulder Pain    Rt shoulder dislocation DOI 02/19/21    History of Present Illness: Kaitlyn Costa is a 35 y.o. female who presents for evaluation of right shoulder pain.  Approximately 2 weeks ago, she sustained an anterior right shoulder dislocation while at work.  She works as a Museum/gallery conservator, and was completing a surgical procedure.  She slipped and fell, dislocating her shoulder.  This required reduction.  She did have a surgical procedure on her right shoulder by Dr. Romeo Apple, approximately 20 years ago for recurrent dislocations.  Following the procedure, she did very well until her most recent injury.  She  has remained in a sling for the most part, but has been removing the sling at times when she is in a controlled setting.  Pain is controlled.  No numbness or tingling distally.  She does feel a certain amount heaviness within the arm.   Review of Systems: No fevers or chills No numbness or tingling No chest pain No shortness of breath No bowel or bladder dysfunction No GI distress No headaches   Medical History:  Past Medical History:  Diagnosis Date  . Complication of anesthesia    locals dont numb very well  . Diabetes mellitus without complication (HCC)   . Dysmenorrhea   . Ectopic pregnancy    treated with medication  . Frank breech presentation 03/26/2019  . GERD (gastroesophageal reflux disease)   . Gestational diabetes   . Headache   . Herniated disc, cervical   . History of diabetes mellitus   . History of genital warts   . HPV in female   . PCOS (polycystic ovarian syndrome)   . Thyroid disease     Past Surgical History:  Procedure Laterality Date  . CESAREAN SECTION N/A 03/26/2019   Procedure: Primary CESAREAN SECTION;  Surgeon: Shea Evans, MD;  Location: [facility] ORS;  Service: Obstetrics;  Laterality: N/A;  EDD: 04/10/19 Allergy: Augmentin, Novolin, Avelox  . CRANIOTOMY    . SHOULDER SURGERY    . skull surgery    . TONSILLECTOMY      Family History  Problem Relation Age of Onset  . Diabetes Maternal Grandmother   .  Diabetes Maternal Grandfather   . Anxiety disorder Maternal Grandfather   . Panic disorder Maternal Grandfather   . Depression Mother   . Schizophrenia Father   . Alcohol abuse Father    Social History   Tobacco Use  . Smoking status: Never Smoker  . Smokeless tobacco: Never Used  Vaping Use  . Vaping Use: Never used  Substance Use Topics  . Alcohol use: Yes    Alcohol/week: 2.0 - 3.0 standard drinks    Types: 2 - 3 Standard drinks or equivalent per week  . Drug use: Not Currently    Allergies  Allergen Reactions  .  Amoxicillin-Pot Clavulanate Anaphylaxis, Shortness Of Breath, Rash and Other (See Comments)    Throat became swollen  . Avelox [Moxifloxacin Hcl In Nacl] Hives, Itching and Swelling  . Avelox [Moxifloxacin] Hives, Itching and Swelling  . Bupropion   . Sertraline Other (See Comments)    Serotonin syndrome    Current Meds  Medication Sig  . L-Methylfolate-Algae (DEPLIN 15) 15-90.314 MG CAPS Take 15 mg by mouth daily.  Marland Kitchen levonorgestrel (MIRENA) 20 MCG/24HR IUD 1 each by Intrauterine route once.    Objective: BP 133/89   Pulse 75   Ht 5\' 5"  (1.651 m)   Wt 176 lb (79.8 kg)   BMI 29.29 kg/m   Physical Exam:  General: Alert and oriented.  No acute distress. Gait: Normal  Evaluation of the right shoulder demonstrates no deformity.  No atrophy is appreciated.  She has a well-healed anterior surgical incision, without surrounding erythema or drainage.  Sensation is intact over the axillary patch.  Her right deltoid muscle fires.  Sensation is intact to the right hand in the superficial radial nerve, median nerve and ulnar nerve distributions.  Fingers are warm and well perfused.  Active motion intact to the AIN/PIN/U nerve distribution.  2+ DP pulse.  Range of motion testing deferred due to recent dislocation.    IMAGING: I personally ordered and reviewed the following images  X-ray of the right shoulder was obtained in clinic today and demonstrates no acute injury.  The glenohumeral joint remains reduced.  Appears to be a Hill-Sachs lesion in the superior and lateral aspect of the humeral head.  Impression: Reduced glenohumeral joint with sequelae of previous shoulder dislocations.  New Medications:  No orders of the defined types were placed in this encounter.     , MD  03/10/2021 11:08 PM

## 2021-03-24 ENCOUNTER — Telehealth: Payer: Self-pay | Admitting: Orthopedic Surgery

## 2021-03-24 NOTE — Telephone Encounter (Signed)
A lady from [facility] called and stated that we need to fax to them an order for MRI for this patient.  If you have any questions you may also call   [facility] phone #  [phone] and fax # [phone]  Thanks

## 2021-03-24 NOTE — Telephone Encounter (Signed)
Information faxed again to [facility].

## 2021-03-26 NOTE — Telephone Encounter (Signed)
Faxed to new # provided.

## 2021-03-26 NOTE — Telephone Encounter (Signed)
The lady from [employer] [facility] called [place] back because they had not received the order. After she checked on this,  apparently she had given [place] a wrong fax number.  Please re-fax order to [phone]  Thanks

## 2021-04-07 ENCOUNTER — Ambulatory Visit
Admission: RE | Admit: 2021-04-07 | Discharge: 2021-04-07 | Disposition: A | Discharge status: Home / Self Care | Payer: 59 | Source: Ambulatory Visit | Attending: Orthopedic Surgery | Admitting: Orthopedic Surgery

## 2021-04-07 ENCOUNTER — Other Ambulatory Visit: Payer: Self-pay

## 2021-04-07 DIAGNOSIS — S43004A Unspecified dislocation of right shoulder joint, initial encounter: Secondary | ICD-10-CM

## 2021-04-29 ENCOUNTER — Encounter: Payer: Self-pay | Admitting: Orthopedic Surgery

## 2021-04-29 ENCOUNTER — Other Ambulatory Visit: Payer: Self-pay

## 2021-04-29 ENCOUNTER — Telehealth: Payer: Self-pay | Admitting: Radiology

## 2021-04-29 ENCOUNTER — Ambulatory Visit (INDEPENDENT_AMBULATORY_CARE_PROVIDER_SITE_OTHER): Payer: No Typology Code available for payment source | Admitting: Orthopedic Surgery

## 2021-04-29 VITALS — Ht 65.0 in | Wt 176.0 lb

## 2021-04-29 DIAGNOSIS — S43004D Unspecified dislocation of right shoulder joint, subsequent encounter: Secondary | ICD-10-CM | POA: Diagnosis not present

## 2021-04-29 NOTE — Telephone Encounter (Signed)
Patient needs physical therapy for her shoulder Wherever work comp will approve Order put in system  Will you get [facility] approval? If they approve [facility], we will need to change order to Occupational therapy  To Tomasa Blase in case they do approve at [facility] / order will need to change

## 2021-04-30 ENCOUNTER — Encounter: Payer: Self-pay | Admitting: Orthopedic Surgery

## 2021-04-30 NOTE — Progress Notes (Signed)
Orthopaedic Clinic Return  Assessment: Kaitlyn Costa is a 35 y.o. female with the following: Right shoulder dislocation; remote history of open stabilization procedure  Plan: Reviewed MRI with the patient.  No definitive tearing of the labrum, but anterior inferior cysts suggestive of injury to the anterior labrum.  Her pain is controlled.  She has continued to use a sling.  I have advised her to stop using the sling, gradually increase the activity of the right shoulder.  We discussed the possibility of proceeding with surgery, and at this point, I am not certain if arthroscopic labral repair is indicated, given her previous history of an open stabilization procedure.  In addition, she states she is not ready to proceed with surgery.  She has a young child, and may not have sufficient support as her husband works on a consistent basis.  As a result, we have elected to proceed with a period of physical therapy.  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  We will see her back in approximately 2 months.  Follow-up: Return in about 2 months (around 06/30/2021).   Subjective:  Chief Complaint  Patient presents with   Shoulder Injury    Rt shoulder dislocation DOI 02/19/21     History of Present Illness: Kaitlyn Costa is a 35 y.o. female who returns to clinic for repeat evaluation of her right shoulder.  Briefly, she sustained a right shoulder dislocation while at work, approximately 2 months ago.  She is obtained an MRI, and is here to discuss the results.  Her pain continues to be well controlled.  She not taking medications on a consistent basis.  She has remained in her sling most of the time.  She has increased her activity on a very limited basis.  Review of Systems: No fevers or chills No numbness or tingling No chest pain No shortness of breath No bowel or bladder dysfunction No GI distress No headaches   Objective: Ht 5'  5" (1.651 m)   Wt 176 lb (79.8 kg)   BMI 29.29 kg/m   Physical Exam:  Alert and oriented.  No acute distress.  Walks with a nonantalgic gait.  Evaluation the right shoulder demonstrates no deformity.  No atrophy is appreciated.  She tolerates gentle range of motion.  There is no obvious humeral head translation with load-and-shift testing.  She is guarding a little bit.  Positive apprehension.  Positive relocation test.  IMAGING: I personally ordered and reviewed the following images:  MRI of the right shoulder demonstrates sequelae of previous surgery.  There is no obvious tearing of the anterior and inferior labrum, however there are some cysts in this region, suggestive of a tear.   Oliver Barre, MD 04/30/2021 9:23 PM

## 2021-06-02 ENCOUNTER — Telehealth: Payer: Self-pay | Admitting: Orthopedic Surgery

## 2021-06-02 DIAGNOSIS — S43004D Unspecified dislocation of right shoulder joint, subsequent encounter: Secondary | ICD-10-CM

## 2021-06-02 NOTE — Telephone Encounter (Signed)
Patient called to relay updated information regarding her physical therapy, which was approved by [employer] comp for [employer] [place], 05/21/21. States therapist advised that only 12 visits were approved. Patient said Dr Dallas Schimke recommended 6 to 8 weeks of therapy.  Please advise about order or new order for [place] to request approval for more visits from Workers comp.

## 2021-06-05 NOTE — Telephone Encounter (Addendum)
Worker's comp aware of needing to have more therapy visits authorized. If a new order is needed, please assist.* *Per adjuster Stacy Gardner, ph [phone] / fax [phone] - (1) states she does need a new order for continued therapy per note.  And (2) states that employer had contacted her regarding patient's follow up appointment date, 07/15/21; requests to have a visit sooner to evaluate. Please advise.

## 2021-06-09 NOTE — Telephone Encounter (Signed)
New order faxed. 

## 2021-06-25 ENCOUNTER — Telehealth: Payer: Self-pay | Admitting: Orthopedic Surgery

## 2021-06-25 NOTE — Telephone Encounter (Signed)
Patient relays that her physical therapist Rob at [facility], [place], would like to see her MRI report from [facility]. Please advise if she would need to request from their clinic directly, or if we may send?

## 2021-06-25 NOTE — Telephone Encounter (Signed)
Sent through releases, no auth required, continuity of care.

## 2021-06-29 ENCOUNTER — Encounter: Payer: Self-pay | Admitting: Orthopedic Surgery

## 2021-07-15 ENCOUNTER — Other Ambulatory Visit: Payer: Self-pay

## 2021-07-15 ENCOUNTER — Encounter: Payer: Self-pay | Admitting: Orthopedic Surgery

## 2021-07-15 ENCOUNTER — Ambulatory Visit (INDEPENDENT_AMBULATORY_CARE_PROVIDER_SITE_OTHER): Payer: No Typology Code available for payment source | Admitting: Orthopedic Surgery

## 2021-07-15 DIAGNOSIS — S43004D Unspecified dislocation of right shoulder joint, subsequent encounter: Secondary | ICD-10-CM

## 2021-07-15 NOTE — Progress Notes (Signed)
Orthopaedic Clinic Return  Assessment: Kaitlyn Costa is a 35 y.o. female with the following: Right shoulder dislocation; remote history of open stabilization procedure  Plan: Patient continues to work diligently with physical therapy.  She is very pleased with her progress to date.  She is also very pleased with her therapist, and the tasks that she is completing.  She does continue to have some pain and weakness with testing of the subscapularis, although there is no clear injury to the subscapularis on the MRI.  She has not returned to work, but she has physical therapy visits authorized until early December.  We will plan to see her back in approximately 2 months for repeat evaluation.  At that time, it is hopeful that she will be able to return to work.  She has expressed interest in a shoulder stabilizer brace, and we will provide her with a prescription for this brace.  If she has any further issues, she will contact the clinic.  Follow-up: Return in about 2 months (around 09/14/2021).   Subjective:  Chief Complaint  Patient presents with   Shoulder Pain    RT shoulder dislocation// DOI 02/19/21    History of Present Illness: Kaitlyn Costa is a 35 y.o. female who returns to clinic for repeat evaluation of her right shoulder.  She sustained a right shoulder dislocation approximately 4 months ago.  She is been working with physical therapy.  She states that she is improving.  She does continue to have some issues with certain motions, causing pain.  She is not taking medications on a consistent basis.  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   Review of Systems: No fevers or chills No numbness or tingling No chest pain No shortness of breath No bowel or bladder dysfunction No GI distress No headaches   Objective: There were no vitals taken for this visit.  Physical Exam:  Alert and oriented.  No acute distress.  Walks with a nonantalgic  gait.  Evaluation the right shoulder demonstrates no deformity.  No atrophy is appreciated.  Pain with liftoff testing.  4/5 subscapularis strength.  Positive apprehension test.  Positive relocation test.  4+/5 strength in supraspinatus.  5/5 strength in the infraspinatus.  Mildly positive Tinel's at the cubital tunnel.  Sensation is full in the small finger.  IMAGING: I personally ordered and reviewed the following images:  No new imaging obtained today.   Oliver Barre, MD 07/15/2021 11:15 PM

## 2021-07-21 ENCOUNTER — Other Ambulatory Visit: Payer: Self-pay

## 2021-07-21 DIAGNOSIS — S43004D Unspecified dislocation of right shoulder joint, subsequent encounter: Secondary | ICD-10-CM

## 2021-08-13 ENCOUNTER — Ambulatory Visit (INDEPENDENT_AMBULATORY_CARE_PROVIDER_SITE_OTHER): Payer: 59

## 2021-08-13 ENCOUNTER — Ambulatory Visit (INDEPENDENT_AMBULATORY_CARE_PROVIDER_SITE_OTHER): Payer: 59 | Admitting: Cardiology

## 2021-08-13 ENCOUNTER — Encounter: Payer: Self-pay | Admitting: Cardiology

## 2021-08-13 VITALS — BP 122/82 | HR 96 | Ht 66.0 in | Wt 175.2 lb

## 2021-08-13 DIAGNOSIS — R079 Chest pain, unspecified: Secondary | ICD-10-CM

## 2021-08-13 DIAGNOSIS — R002 Palpitations: Secondary | ICD-10-CM

## 2021-08-13 NOTE — Assessment & Plan Note (Signed)
Dramatic changes in heart rate noted.  Question SVT, PVCs.  Could there be a vagal component resulting in decreased heart rate thready pulse.  We will go ahead and place a Zio patch monitor for 14 days.  This will be helpful to eliminate any adverse arrhythmias hopefully.  Continue with conservative measures with decrease caffeine etc.  I will also check an echocardiogram to ensure proper structure and function of her heart.

## 2021-08-13 NOTE — Assessment & Plan Note (Signed)
Checking echocardiogram to ensure proper structure and function.

## 2021-08-13 NOTE — Progress Notes (Signed)
Cardiology Office Note:    Date:  08/13/2021   ID:  Kaitlyn Costa, DOB 1985-12-25, MRN [id number]  PCP:  Medicine, [facility]   [facility] Providers Cardiologist:  None     Referring MD: Ignatius Specking, MD    History of Present Illness:    Kaitlyn Costa is a 35 y.o. female here for evaluation of palpitations at the request of Sherril Croon, MD.   Had Wellbutrin post partum. Insomnia. Off of that. Then went on Zoloft. Felt happy, great. Then May 2021 - felt like heart stopped, sitting in truck. Daughter crying. Felt low and tight and felt like fert slowed and went really slow and paused and took off, wave of sensensation, warming. EMS came ECG showed an arrhythmia. ? PVC. HR was tachy 180-190 then suddenly slowed and increased. EMS thought serotonin syndrome. Genetic testing donw at [facility]. MTHFR gene mutation - Homocyctene increase, blood clotting...  Felt night right after booster. Had COVID in Jan - had to stop running.   Scared to run. Has had cluster of palps. 2-3 at time. Happened when sat down or lie down. No change with left or right side. Multiple daily then go away.   Decaf now. Went off Deplin medication. Started back 2 days ago. Not as frequent. Once a month feels like heart stopped. Random. Now coughs if she feels. 2-3 minutes then has diarrhea. ?Vagal/adren.   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   Levomefolate Glucosamine (METHYLFOLATE PO) Take 1,700 mcg by mouth.   levonorgestrel (MIRENA) 20 MCG/24HR IUD 1 each by Intrauterine route once.     Allergies:   Amoxicillin-pot clavulanate, Avelox [moxifloxacin hcl in nacl], Avelox [moxifloxacin], Bupropion, and Sertraline   Social History   Socioeconomic History   Marital status: Married    Spouse name: Not on file   Number of children: Not on file   Years of education: Not on file   Highest education level: Not on file  Occupational History   Not on file  Tobacco Use   Smoking status: Never   Smokeless tobacco: Never  Vaping Use   Vaping Use: Never used  Substance and Sexual Activity   Alcohol use: Yes    Alcohol/week: 2.0 - 3.0 standard drinks    Types: 2 - 3 Standard drinks or equivalent per week   Drug use: Not Currently   Sexual activity: Yes  Partners: Male  Other Topics Concern   Not on file  Social History Narrative   Not on file   Social Determinants of Health   Financial Resource Strain: Not on file  Food Insecurity: Not on file  Transportation Needs: Not on file  Physical Activity: Not on file  Stress: Not on file  Social Connections: Not on file     Family History: The patient's family history includes Alcohol abuse in her father; Anxiety disorder in her maternal grandfather; Depression in her mother; Diabetes in her maternal grandfather and maternal grandmother; Heart disease in her paternal grandfather; Panic disorder in her maternal grandfather; Schizophrenia in her father.  ROS:   Please see the history of present illness.    No fevers chills  bleeding all other systems reviewed and are negative.  EKGs/Labs/Other Studies Reviewed:    The following studies were reviewed today: Prior office notes reviewed.  Genetic study reviewed.  EKG:  Sinus rhythm 82 normal intervals no other abnormalities from 02/19/2021  Recent Labs: No results found for requested labs within last 8760 hours.  Recent Lipid Panel    Component Value Date/Time   CHOL 186 08/09/2016 1601   TRIG 84 08/09/2016 1601   HDL 69 08/09/2016 1601   CHOLHDL 2.7 08/09/2016 1601   VLDL 17 08/09/2016 1601   LDLCALC 100 (H) 08/09/2016 1601     Risk Assessment/Calculations:          Physical Exam:    VS:  BP 122/82   Pulse 96   Ht 5\' 6"  (1.676 m)   Wt 175 lb 3.2 oz (79.5 kg)   SpO2 96%   BMI 28.28 kg/m     Wt Readings from Last 3 Encounters:  08/13/21 175 lb 3.2 oz (79.5 kg)  04/29/21 176 lb (79.8 kg)  03/10/21 176 lb (79.8 kg)     GEN:  Well nourished, well developed in no acute distress HEENT: Normal NECK: No JVD; No carotid bruits LYMPHATICS: No lymphadenopathy CARDIAC: RRR, no murmurs, rubs, gallops RESPIRATORY:  Clear to auscultation without rales, wheezing or rhonchi  ABDOMEN: Soft, non-tender, non-distended, pulsatile aorta-this has been worked up previously normal MUSCULOSKELETAL:  No edema; No deformity  SKIN: Warm and dry NEUROLOGIC:  Alert and oriented x 3 PSYCHIATRIC:  Normal affect   ASSESSMENT:    1. Palpitations   2. Chest pain of uncertain etiology    PLAN:    In order of problems listed above:  Palpitations Dramatic changes in heart rate noted.  Question SVT, PVCs.  Could there be a vagal component resulting in decreased heart rate thready pulse.  We will go ahead and place a Zio patch monitor for 14 days.  This will be helpful to eliminate any adverse arrhythmias hopefully.  Continue with conservative measures with decrease caffeine etc.  I will also check an echocardiogram to ensure proper structure and function of her  heart.  Chest pain of uncertain etiology Checking echocardiogram to ensure proper structure and function.       Medication Adjustments/Labs and Tests Ordered: Current medicines are reviewed at length with the patient today.  Concerns regarding medicines are outlined above.  Orders Placed This Encounter  Procedures   LONG TERM MONITOR (3-14 DAYS)   ECHOCARDIOGRAM COMPLETE   No orders of the defined types were placed in this encounter.   Patient Instructions  Medication Instructions:  The current medical regimen is effective;  continue present plan and medications.  *If you need a refill on your cardiac  medications before your next appointment, please call your pharmacy*  Testing/Procedures: Your physician has requested that you have an echocardiogram. Echocardiography is a painless test that uses sound waves to create images of your heart. It provides your doctor with information about the size and shape of your heart and how well your heart's chambers and valves are working. This procedure takes approximately one hour. There are no restrictions for this procedure.  ZIO XT- Long Term Monitor Instructions  Your physician has requested you wear a ZIO patch monitor for 14 days.  This is a single patch monitor. [employer] supplies one patch monitor per enrollment. Additional stickers are not available. Please do not apply patch if you will be having a Nuclear Stress Test,  Echocardiogram, Cardiac CT, MRI, or Chest Xray during the period you would be wearing the  monitor. The patch cannot be worn during these tests. You cannot remove and re-apply the  ZIO XT patch monitor.  Your ZIO patch monitor will be mailed 3 day USPS to your address on file. It may take 3-5 days  to receive your monitor after you have been enrolled.  Once you have received your monitor, please review the enclosed instructions. Your monitor  has already been registered assigning a specific monitor serial # to  you.  Billing and Patient Assistance Program Information  We have supplied [employer] with any of your insurance information on file for billing purposes. [employer] offers a sliding scale Patient Assistance Program for patients that do not have  insurance, or whose insurance does not completely cover the cost of the ZIO monitor.  You must apply for the Patient Assistance Program to qualify for this discounted rate.  To apply, please call [employer] at [phone], select option 4, select option 2, ask to apply for  Patient Assistance Program. Meredeth Ide will ask your household income, and how many people  are in your household. They will quote your out-of-pocket cost based on that information.  [employer] will also be able to set up a 3-month, interest-free payment plan if needed.  Applying the monitor   Shave hair from upper left chest.  Hold abrader disc by orange tab. Rub abrader in 40 strokes over the upper left chest as  indicated in your monitor instructions.  Clean area with 4 enclosed alcohol pads. Let dry.  Apply patch as indicated in monitor instructions. Patch will be placed under collarbone on left  side of chest with arrow pointing upward.  Rub patch adhesive wings for 2 minutes. Remove white label marked "1". Remove the white  label marked "2". Rub patch adhesive wings for 2 additional minutes.  While looking in a mirror, press and release button in center of patch. A small green light will  flash 3-4 times. This will be your only indicator that the monitor has been turned on.  Do not shower for the first 24 hours. You may shower after the first 24 hours.  Press the button if you feel a symptom. You will hear a small click. Record Date, Time and  Symptom in the Patient Logbook.  When you are ready to remove the patch, follow instructions on the last 2 pages of Patient  Logbook. Stick patch monitor onto the last page of Patient Logbook.  Place Patient Logbook in the blue and white box.  Use locking tab on box and tape box closed  securely. The blue and white box has prepaid postage on it. Please place it in the mailbox as  soon as  possible. Your physician should have your test results approximately 7 days after the  monitor has been mailed back to [facility].  Call [facility] Customer Care at [phone] if you have questions regarding  your ZIO XT patch monitor. Call them immediately if you see an orange light blinking on your  monitor.  If your monitor falls off in less than 4 days, contact our Monitor department at [phone].  If your monitor becomes loose or falls off after 4 days call [employer] at [phone] for  suggestions on securing your monitor  Follow-Up: At [facility], you and your health needs are our priority.  As part of our continuing mission to provide you with exceptional heart care, we have created designated Provider Care Teams.  These Care Teams include your primary Cardiologist (physician) and Advanced Practice Providers (APPs -  Physician Assistants and Nurse Practitioners) who all work together to provide you with the care you need, when you need it.  We recommend signing up for the patient portal called "MyChart".  Sign up information is provided on this After Visit Summary.  MyChart is used to connect with patients for Virtual Visits (Telemedicine).  Patients are able to view lab/test results, encounter notes, upcoming appointments, etc.  Non-urgent messages can be sent to your provider as well.   To learn more about what you can do with MyChart, go to [email or web address].    Your next appointment:   Follow up will be determined after the results of the above testing.  Thank you for choosing [facility]!!     Signed, Donato Schultz, MD  08/13/2021 5:22 PM    [facility]

## 2021-08-13 NOTE — Patient Instructions (Addendum)
Medication Instructions:  The current medical regimen is effective;  continue present plan and medications.  *If you need a refill on your cardiac medications before your next appointment, please call your pharmacy*  Testing/Procedures: Your physician has requested that you have an echocardiogram. Echocardiography is a painless test that uses sound waves to create images of your heart. It provides your doctor with information about the size and shape of your heart and how well your heart's chambers and valves are working. This procedure takes approximately one hour. There are no restrictions for this procedure.  ZIO XT- Long Term Monitor Instructions  Your physician has requested you wear a ZIO patch monitor for 14 days.  This is a single patch monitor. [employer] supplies one patch monitor per enrollment. Additional stickers are not available. Please do not apply patch if you will be having a Nuclear Stress Test,  Echocardiogram, Cardiac CT, MRI, or Chest Xray during the period you would be wearing the  monitor. The patch cannot be worn during these tests. You cannot remove and re-apply the  ZIO XT patch monitor.  Your ZIO patch monitor will be mailed 3 day USPS to your address on file. It may take 3-5 days  to receive your monitor after you have been enrolled.  Once you have received your monitor, please review the enclosed instructions. Your monitor  has already been registered assigning a specific monitor serial # to you.  Billing and Patient Assistance Program Information  We have supplied [employer] with any of your insurance information on file for billing purposes. [employer] offers a sliding scale Patient Assistance Program for patients that do not have  insurance, or whose insurance does not completely cover the cost of the ZIO monitor.  You must apply for the Patient Assistance Program to qualify for this discounted rate.  To apply, please call [employer] at [phone], select option 4,  select option 2, ask to apply for  Patient Assistance Program. Theodore Demark will ask your household income, and how many people  are in your household. They will quote your out-of-pocket cost based on that information.  [employer] will also be able to set up a 69-month interest-free payment plan if needed.  Applying the monitor   Shave hair from upper left chest.  Hold abrader disc by orange tab. Rub abrader in 40 strokes over the upper left chest as  indicated in your monitor instructions.  Clean area with 4 enclosed alcohol pads. Let dry.  Apply patch as indicated in monitor instructions. Patch will be placed under collarbone on left  side of chest with arrow pointing upward.  Rub patch adhesive wings for 2 minutes. Remove white label marked "1". Remove the white  label marked "2". Rub patch adhesive wings for 2 additional minutes.  While looking in a mirror, press and release button in center of patch. A small green light will  flash 3-4 times. This will be your only indicator that the monitor has been turned on.  Do not shower for the first 24 hours. You may shower after the first 24 hours.  Press the button if you feel a symptom. You will hear a small click. Record Date, Time and  Symptom in the Patient Logbook.  When you are ready to remove the patch, follow instructions on the last 2 pages of Patient  Logbook. Stick patch monitor onto the last page of Patient Logbook.  Place Patient Logbook in the blue and white box. Use locking tab on box and tape box  closed  securely. The blue and white box has prepaid postage on it. Please place it in the mailbox as  soon as possible. Your physician should have your test results approximately 7 days after the  monitor has been mailed back to [facility]  Call [facility] Customer Care at [phone] if you have questions regarding  your ZIO XT patch monitor. Call them immediately if you see an orange light blinking on your  monitor.  If your  monitor falls off in less than 4 days, contact our Monitor department at [phone].  If your monitor becomes loose or falls off after 4 days call [employer] at [phone] for  suggestions on securing your monitor  Follow-Up: At [facility], you and your health needs are our priority.  As part of our continuing mission to provide you with exceptional heart care, we have created designated Provider Care Teams.  These Care Teams include your primary Cardiologist (physician) and Advanced Practice Providers (APPs -  Physician Assistants and Nurse Practitioners) who all work together to provide you with the care you need, when you need it.  We recommend signing up for the patient portal called "MyChart".  Sign up information is provided on this After Visit Summary.  MyChart is used to connect with patients for Virtual Visits (Telemedicine).  Patients are able to view lab/test results, encounter notes, upcoming appointments, etc.  Non-urgent messages can be sent to your provider as well.   To learn more about what you can do with MyChart, go to [email or web address].    Your next appointment:   Follow up will be determined after the results of the above testing.  Thank you for choosing [facility]!!

## 2021-09-16 ENCOUNTER — Encounter: Payer: Self-pay | Admitting: Orthopedic Surgery

## 2021-09-16 ENCOUNTER — Other Ambulatory Visit: Payer: Self-pay

## 2021-09-16 ENCOUNTER — Ambulatory Visit (INDEPENDENT_AMBULATORY_CARE_PROVIDER_SITE_OTHER): Payer: No Typology Code available for payment source | Admitting: Orthopedic Surgery

## 2021-09-16 VITALS — BP 125/78 | HR 61 | Ht 66.0 in | Wt 175.0 lb

## 2021-09-16 DIAGNOSIS — S43004D Unspecified dislocation of right shoulder joint, subsequent encounter: Secondary | ICD-10-CM

## 2021-09-16 NOTE — Patient Instructions (Signed)
Ok to return to work 09/28/21  Please provide a letter

## 2021-09-16 NOTE — Progress Notes (Signed)
Orthopaedic Clinic Return  Assessment: Kaitlyn Costa is a 35 y.o. female with the following: Right shoulder dislocation; remote history of open stabilization procedure  Plan: She has completed PT Shoulder brace has been effective She feels comfortable returning to work We discussed limitations, but she can return to full activities Follow up as needed Letter for work has been provided  Follow-up: Return if symptoms worsen or fail to improve.   Subjective:  Chief Complaint  Patient presents with   Shoulder Pain    Rt shoulder dislocation, does have her brace and has gone to OT.     History of Present Illness: Kaitlyn Costa is a 35 y.o. female who returns to clinic for repeat evaluation of her right shoulder.  She sustained a dislocation at work several months ago.  She has been working hard with occupational therapy.  She has a shoulder brace, that fits her well, and provides additional stability.  She feels ready to return to work.  Occasional pain in the anterior shoulder.  Otherwise, she has good range of motion and good strength.  She does note some instability with some activities.   Review of Systems: No fevers or chills No numbness or tingling No chest pain No shortness of breath No bowel or bladder dysfunction No GI distress No headaches   Objective: BP 125/78    Pulse 61    Ht 5\' 6"  (1.676 m)    Wt 175 lb (79.4 kg)    BMI 28.25 kg/m   Physical Exam:  Alert and oriented.  No acute distress.  Walks with a nonantalgic gait.  No deformity the right shoulder.  Well-healed surgical incisions.  Pain in the anterior shoulder with empty can testing position.  5/5 strength in the supraspinatus otherwise.  4+/5 strength in the subscapularis.  Positive O'Brien's.  No apprehension in the 90/90 position.  Fingers are warm and well-perfused.  IMAGING: I personally ordered and reviewed the following images:  No new imaging obtained today.   ,  MD 09/16/2021 8:58 AM

## 2021-09-17 ENCOUNTER — Telehealth: Payer: Self-pay | Admitting: Cardiology

## 2021-09-17 NOTE — Telephone Encounter (Signed)
·   No adverse rhythms   Rare ectopy   Symptoms were occasionally associated with PVC's.   Continue with conservative mgt. Decrease caffiene. Maintain good sleep hygeine. OK to run.   Donato Schultz, MD     Attempted to contact pt to review results.  NA and mailbox is full.  Unable to leave a message.  Will continue to attempt to contact pt.

## 2021-09-17 NOTE — Telephone Encounter (Signed)
Patient was calling in to get results from her monitor she sent in. Please advise

## 2021-09-17 NOTE — Telephone Encounter (Signed)
Reviewed results with pt who reports she is having "episodes in clusters" for 3 to 4 days in a row 6 to 8 times a day.  She has stopped all caffeine and OTC supplements.  Generally "occurs with bending down (like orthostatic) or laying still."   Advised I will forward to Dr Anne Fu for his knowledge and any further orders.  Pt also has echo scheduled soon.

## 2021-09-19 NOTE — Telephone Encounter (Signed)
Ok to try Toprol xl 25 mg po qd.  Follow up with APP in 3 months to see results.  Donato Schultz, MD

## 2021-09-21 NOTE — Telephone Encounter (Signed)
Pt wants to wait to start a beta blocker until after she has her echo.  She states she has a "tremendous amount of medical knowledge" and it is not safe to take a beta blocker for something that doesn't occur on a daily basis.  She also states she does not agree the advice of - OK to run per Dr Anne Fu until something more definitive has been diagnosed.   Beta blocker was not called into pharmacy.  Advised pt we will contact her with the results of echo once they are available.  Advised to call back if further questions or concerns prior to then.

## 2021-09-22 NOTE — Telephone Encounter (Signed)
OK with me Laxmi Choung, MD  

## 2021-09-23 ENCOUNTER — Telehealth: Payer: Self-pay

## 2021-09-23 NOTE — Telephone Encounter (Signed)
Patient called stating she needs, for legal purposes, the percentage of loss with her injury. She would like for this to be documented in her notes if at all possible.

## 2021-09-29 ENCOUNTER — Other Ambulatory Visit: Payer: Self-pay

## 2021-09-29 ENCOUNTER — Ambulatory Visit (HOSPITAL_COMMUNITY)
Admission: RE | Admit: 2021-09-29 | Discharge: 2021-09-29 | Disposition: A | Discharge status: Home / Self Care | Payer: 59 | Source: Ambulatory Visit | Attending: Cardiology | Admitting: Cardiology

## 2021-09-29 DIAGNOSIS — R079 Chest pain, unspecified: Secondary | ICD-10-CM

## 2021-09-29 DIAGNOSIS — R002 Palpitations: Secondary | ICD-10-CM | POA: Insufficient documentation

## 2021-09-29 LAB — ECHOCARDIOGRAM COMPLETE
Area-P 1/2: 3.53 cm2
S' Lateral: 2.6 cm

## 2021-09-29 NOTE — Progress Notes (Signed)
*  PRELIMINARY RESULTS* Echocardiogram 2D Echocardiogram has been performed.  Stacey Drain 09/29/2021, 4:08 PM

## 2021-10-02 NOTE — Telephone Encounter (Signed)
Spoke with Dr. Dallas Schimke about paperwork, states he's been doing his research on completing percentage ratings. Stated that there's two different reporting types for this form in [place] ([employer] and [employer]), and he would like to know which one they need to better assist in completion. Attempted to call the patient to figure out which set of forms will be needed by her insurer. No answer and VM full.

## 2021-10-02 NOTE — Telephone Encounter (Signed)
Pt called back and states that the company is just requesting documentation of the conversation that was had during her appointment. Pt states that she will call and see if they require that but they only requested proof that all of the concerns are documented in her chart.

## 2021-10-06 ENCOUNTER — Telehealth: Payer: Self-pay | Admitting: Cardiology

## 2021-10-06 NOTE — Telephone Encounter (Signed)
The patient has been notified of the result and verbalized understanding.  All questions (if any) were answered. [place], RN 10/06/2021 3:09 PM   Pt is concerned that Echo and heart monitor do not explain symptoms.  Pt reports that once started on Buspar; no longer has feelings of heart pauses.  Reports continued cluster palpitations has not noted any triggers.  Reports often feeling stressed, denies increased caffeine intake, makes effort to drink 64 oz of water daily.  Questions if symptoms could be r/t to a previous serotonin syndrome occurrence.  Pt would like to discuss concerns with Dr. Marlou Porch scheduled pt for 11/13/21 in [place].

## 2021-10-06 NOTE — Telephone Encounter (Signed)
Pt is calling for Echo results from 09/29/21

## 2021-10-07 ENCOUNTER — Telehealth: Payer: Self-pay | Admitting: Orthopedic Surgery

## 2021-10-07 NOTE — Telephone Encounter (Signed)
Patient had left a message (yesterday, 10/06/21) regarding update/addendum to her last office visit notes per previous telephone call. I was unable to determine if it has been updated per her previous request. Please advise.

## 2021-10-09 NOTE — Telephone Encounter (Signed)
I called back to patient to offer appointment to address, as per response. Patient relayed that she had settled yesterday regarding her worker's comp claim; therefore,states she is okay, and will not need to schedule a visit at this time.

## 2021-10-09 NOTE — Telephone Encounter (Signed)
-----   Message from Elizabeth Sauer, LPN sent at [id number]  9:47 AM EST ----- Regarding: RE: Another fol/up call    I've sent all messages to Dr. Amedeo Kinsman with no response. It may be better if she just comes in and talk through everything. I also asked her to figure out how her company would like her percentages documented. So if you could call her and schedule an appointment and remind her to ask about the version of documentation? Thank you [name].    ----- Message ----- From: Uvaldo Bristle Sent: 10/08/2021   9:42 AM EST To: Elizabeth Sauer, LPN Subject: Another fol/up call                            Kaitlyn Costa, Kaitlyn Costa [id number] is again asking if notes have been updated per last phone note(s). Is she needing to return to have this done is what she is also asking. Please advise.

## 2021-11-13 ENCOUNTER — Ambulatory Visit: Payer: 59 | Admitting: Cardiology

## 2021-12-15 ENCOUNTER — Other Ambulatory Visit (HOSPITAL_COMMUNITY): Payer: Self-pay | Admitting: Internal Medicine

## 2021-12-15 ENCOUNTER — Other Ambulatory Visit (HOSPITAL_COMMUNITY): Payer: Self-pay | Admitting: Otolaryngology

## 2021-12-15 ENCOUNTER — Other Ambulatory Visit: Payer: Self-pay

## 2021-12-15 ENCOUNTER — Ambulatory Visit (HOSPITAL_COMMUNITY)
Admission: RE | Admit: 2021-12-15 | Discharge: 2021-12-15 | Disposition: A | Discharge status: Home / Self Care | Payer: 59 | Source: Ambulatory Visit | Attending: Internal Medicine | Admitting: Internal Medicine

## 2021-12-15 DIAGNOSIS — R413 Other amnesia: Secondary | ICD-10-CM | POA: Diagnosis present

## 2021-12-15 DIAGNOSIS — R269 Unspecified abnormalities of gait and mobility: Secondary | ICD-10-CM | POA: Diagnosis not present

## 2021-12-15 DIAGNOSIS — R42 Dizziness and giddiness: Secondary | ICD-10-CM | POA: Diagnosis not present

## 2021-12-15 DIAGNOSIS — W109XXA Fall (on) (from) unspecified stairs and steps, initial encounter: Secondary | ICD-10-CM | POA: Diagnosis not present

## 2021-12-15 DIAGNOSIS — W19XXXA Unspecified fall, initial encounter: Secondary | ICD-10-CM

## 2021-12-18 ENCOUNTER — Ambulatory Visit: Payer: 59 | Admitting: Cardiology

## 2022-01-28 ENCOUNTER — Ambulatory Visit: Payer: 59 | Admitting: Cardiology

## 2022-09-15 IMAGING — CT CT HEAD W/O CM
3 of 4 series · 15 of 47 positions shown, 18 images · non-contrast
Comparison: None.

CLINICAL DATA: Dizziness, memory loss and gait disturbance since
falling down the stairs 3 days previously.



[Series 2: head w o · axial · 0.41mm/px · z∈[+51,+171]mm · 9 of 30 slices shown, 12 images]
[im 3/30  brain]
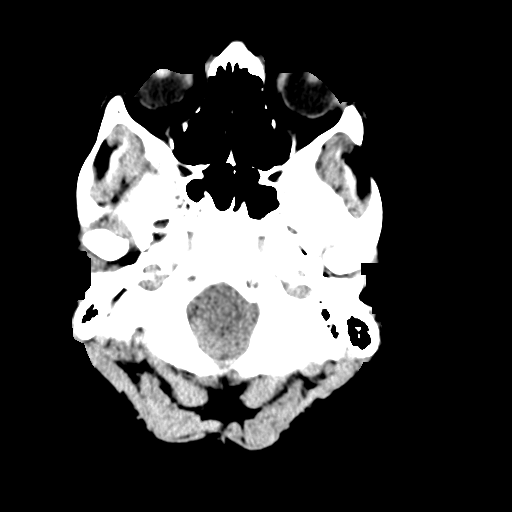
[im 3/30  bone]
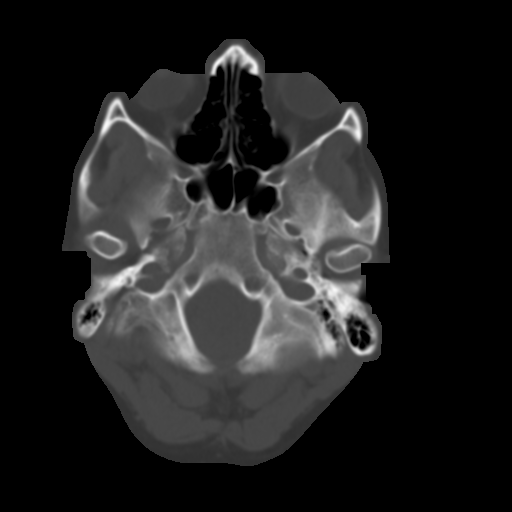
[im 7/30  brain]
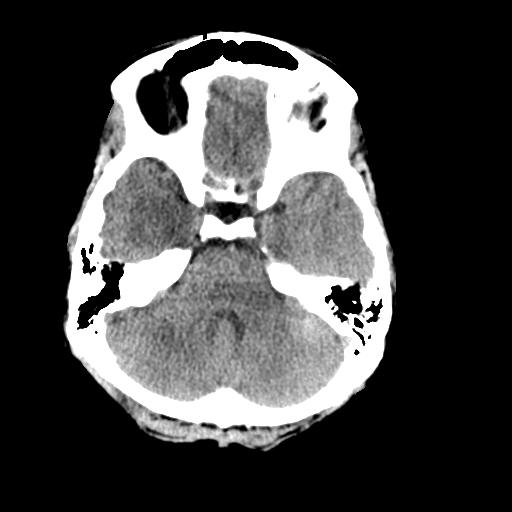
[im 9/30  brain]
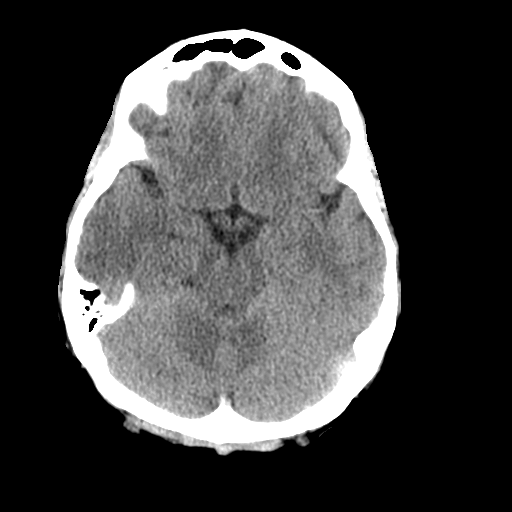
[im 13/30  brain]
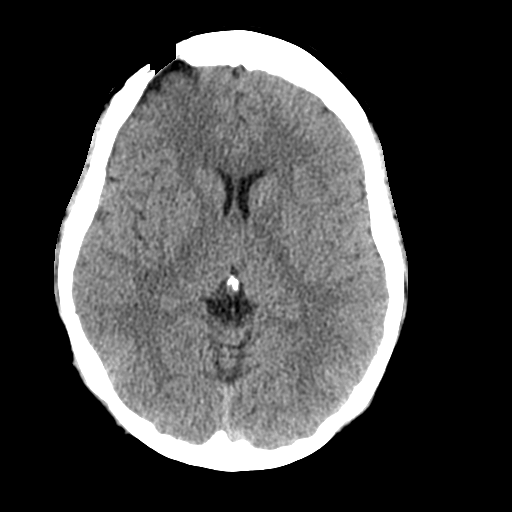
[im 15/30  brain]
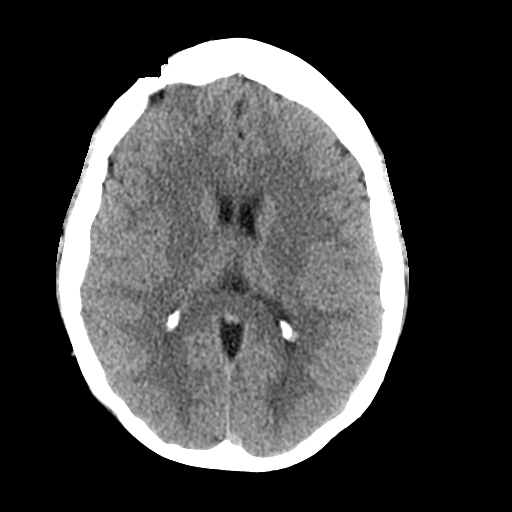
[im 15/30  bone]
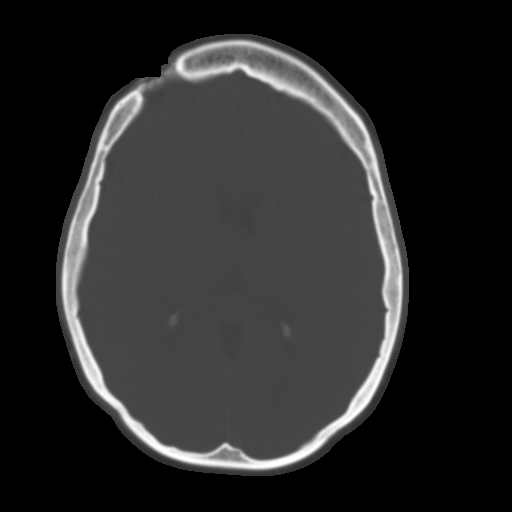
[im 17/30  brain]
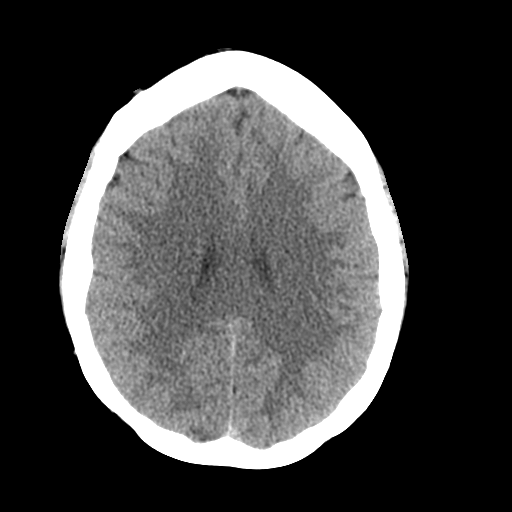
[im 21/30  brain]
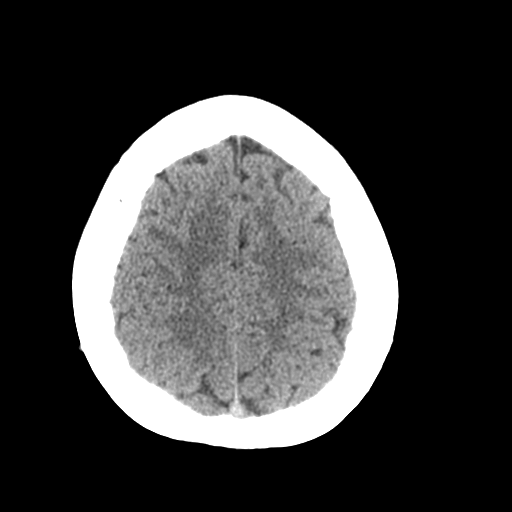
[im 23/30  brain]
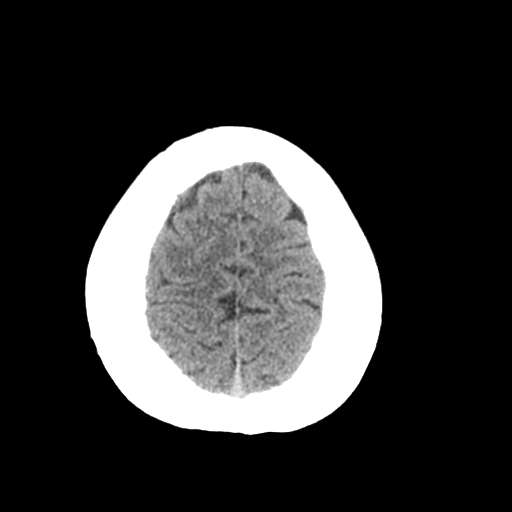
[im 27/30  brain]
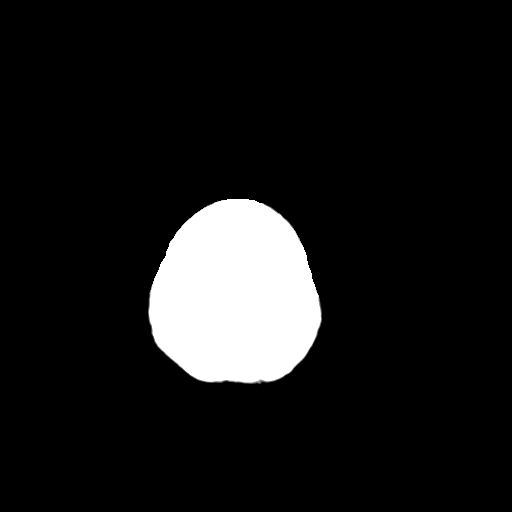
[im 27/30  bone]
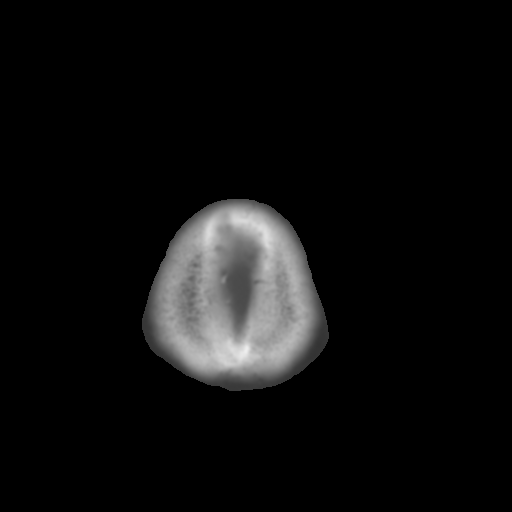

[Series 4: coronal soft · coronal · 0.30mm/px · 3 of 67 slices shown]
[im 23/67  brain]
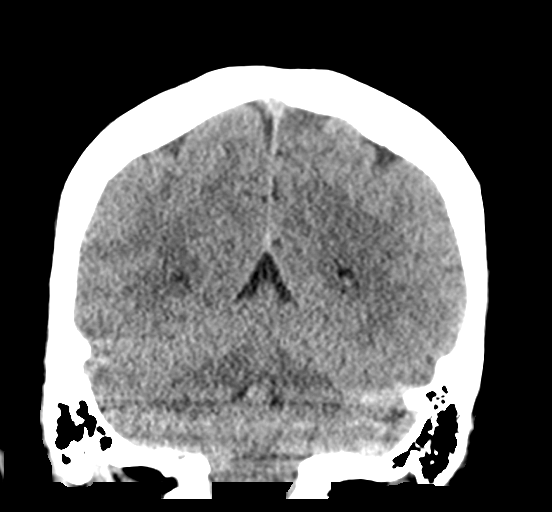
[im 30/67  brain]
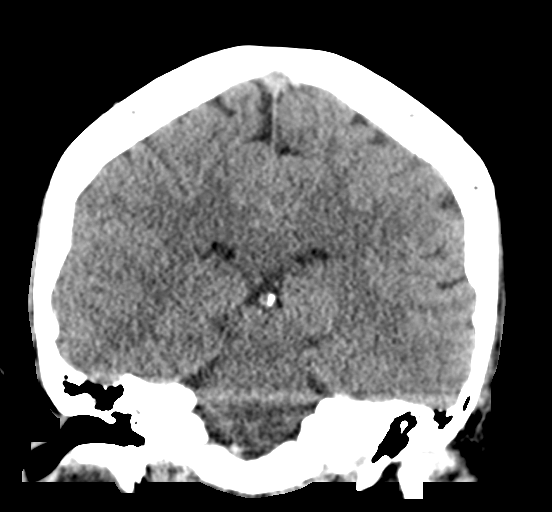
[im 37/67  brain]
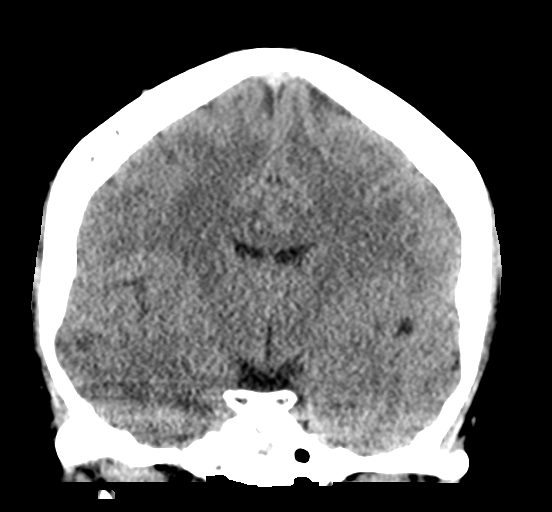

[Series 5: sagittal soft · sagittal · 0.31mm/px · 3 of 53 slices shown]
[im 18/53  brain]
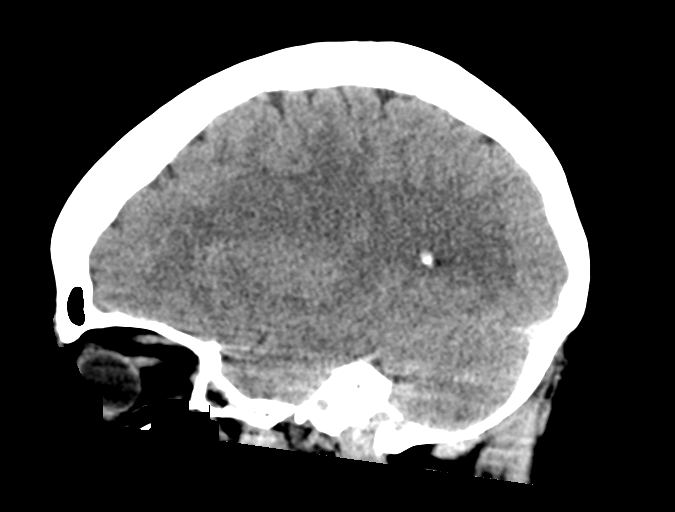
[im 27/53  brain]
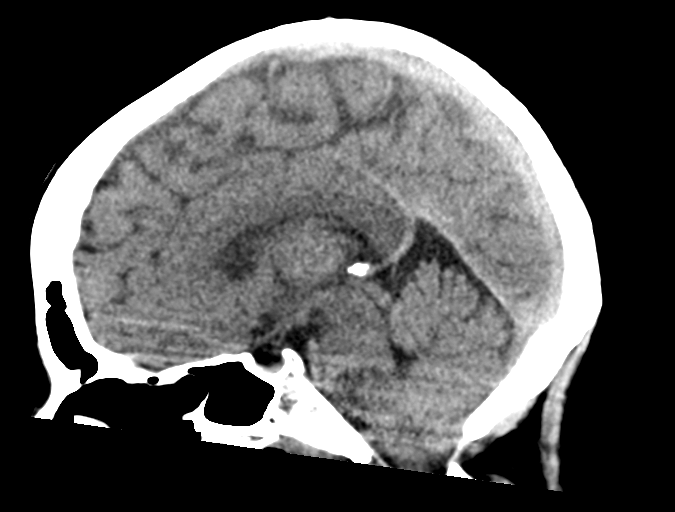
[im 35/53  brain]
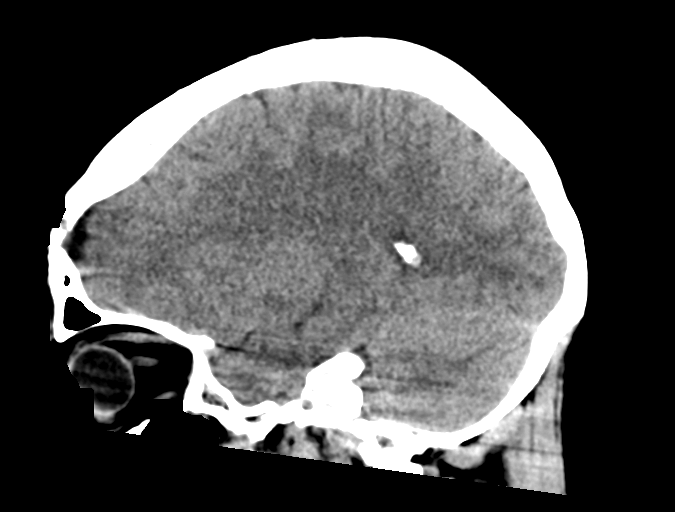

[15 of 47 positions shown; findings below may reference images not displayed]

FINDINGS: Brain: No evidence of acute infarction, hemorrhage, hydrocephalus,
extra-axial collection or mass lesion/mass effect.

Vascular: No hyperdense vessel or unexpected calcification.

Skull: Surgical changes of prior craniotomy at the right
frontotemporal calvarium. Otherwise, no focal lesion.

Sinuses/Orbits: Incompletely imaged mucous retention cyst versus
polyp in the left maxillary sinus. The paranasal sinuses are
otherwise clear.

Other: None.
IMPRESSION: 1. No acute intracranial abnormality.
2. Surgical changes of prior right frontotemporal craniotomy.

## 2022-12-02 ENCOUNTER — Encounter: Payer: Self-pay | Admitting: Radiology

## 2022-12-23 ENCOUNTER — Ambulatory Visit: Admission: EM | Admit: 2022-12-23 | Discharge: 2022-12-23 | Discharge status: Against Medical Advice | Payer: Self-pay

## 2023-08-17 ENCOUNTER — Encounter: Payer: Self-pay | Admitting: Psychiatry
